# Patient Record
Sex: Male | Born: 1980
Health system: Southern US, Community
[De-identification: ages and names within clinical notes are randomized; demographics above are authoritative.]

## PROBLEM LIST (undated history)

## (undated) DIAGNOSIS — K219 Gastro-esophageal reflux disease without esophagitis: Secondary | ICD-10-CM

## (undated) DIAGNOSIS — F32A Depression, unspecified: Secondary | ICD-10-CM

## (undated) DIAGNOSIS — K449 Diaphragmatic hernia without obstruction or gangrene: Secondary | ICD-10-CM

## (undated) DIAGNOSIS — F329 Major depressive disorder, single episode, unspecified: Secondary | ICD-10-CM

---

## 2004-05-08 ENCOUNTER — Ambulatory Visit: Payer: Self-pay | Admitting: Gastroenterology

## 2004-05-16 ENCOUNTER — Ambulatory Visit (HOSPITAL_COMMUNITY): Admission: RE | Admit: 2004-05-16 | Discharge: 2004-05-16 | Payer: Self-pay | Admitting: Gastroenterology

## 2004-06-03 ENCOUNTER — Ambulatory Visit: Payer: Self-pay | Admitting: Gastroenterology

## 2004-06-03 ENCOUNTER — Encounter: Payer: Self-pay | Admitting: Internal Medicine

## 2004-06-11 ENCOUNTER — Ambulatory Visit (HOSPITAL_COMMUNITY): Admission: RE | Admit: 2004-06-11 | Discharge: 2004-06-11 | Payer: Self-pay | Admitting: Gastroenterology

## 2005-01-13 ENCOUNTER — Emergency Department (HOSPITAL_COMMUNITY): Admission: EM | Admit: 2005-01-13 | Discharge: 2005-01-13 | Payer: Self-pay | Admitting: Emergency Medicine

## 2006-05-05 ENCOUNTER — Emergency Department (HOSPITAL_COMMUNITY): Admission: EM | Admit: 2006-05-05 | Discharge: 2006-05-05 | Payer: Self-pay | Admitting: Emergency Medicine

## 2006-05-29 ENCOUNTER — Emergency Department (HOSPITAL_COMMUNITY): Admission: EM | Admit: 2006-05-29 | Discharge: 2006-05-29 | Payer: Self-pay | Admitting: Emergency Medicine

## 2006-06-12 ENCOUNTER — Ambulatory Visit: Payer: Self-pay | Admitting: Internal Medicine

## 2006-07-03 ENCOUNTER — Emergency Department (HOSPITAL_COMMUNITY): Admission: EM | Admit: 2006-07-03 | Discharge: 2006-07-03 | Payer: Self-pay | Admitting: Emergency Medicine

## 2006-08-04 ENCOUNTER — Emergency Department (HOSPITAL_COMMUNITY): Admission: EM | Admit: 2006-08-04 | Discharge: 2006-08-04 | Payer: Self-pay | Admitting: Family Medicine

## 2006-08-07 ENCOUNTER — Emergency Department (HOSPITAL_COMMUNITY): Admission: EM | Admit: 2006-08-07 | Discharge: 2006-08-07 | Payer: Self-pay | Admitting: Family Medicine

## 2006-12-10 ENCOUNTER — Ambulatory Visit: Payer: Self-pay | Admitting: Internal Medicine

## 2007-05-04 ENCOUNTER — Emergency Department (HOSPITAL_COMMUNITY): Admission: EM | Admit: 2007-05-04 | Discharge: 2007-05-04 | Payer: Self-pay | Admitting: Emergency Medicine

## 2008-08-17 ENCOUNTER — Ambulatory Visit: Payer: Self-pay | Admitting: Gastroenterology

## 2008-08-24 ENCOUNTER — Ambulatory Visit: Payer: Self-pay | Admitting: Gastroenterology

## 2008-08-31 ENCOUNTER — Ambulatory Visit: Payer: Self-pay | Admitting: Internal Medicine

## 2008-09-08 ENCOUNTER — Ambulatory Visit: Payer: Self-pay | Admitting: Gastroenterology

## 2008-09-12 ENCOUNTER — Ambulatory Visit: Payer: Self-pay | Admitting: Internal Medicine

## 2008-09-18 ENCOUNTER — Ambulatory Visit: Payer: Self-pay | Admitting: Gastroenterology

## 2008-09-28 ENCOUNTER — Ambulatory Visit: Payer: Self-pay | Admitting: Gastroenterology

## 2008-10-05 ENCOUNTER — Ambulatory Visit: Payer: Self-pay | Admitting: Gastroenterology

## 2008-10-17 ENCOUNTER — Ambulatory Visit: Payer: Self-pay | Admitting: Gastroenterology

## 2008-10-31 ENCOUNTER — Ambulatory Visit: Payer: Self-pay | Admitting: Gastroenterology

## 2008-11-07 ENCOUNTER — Ambulatory Visit: Payer: Self-pay | Admitting: Gastroenterology

## 2011-08-10 ENCOUNTER — Emergency Department (HOSPITAL_COMMUNITY)
Admission: EM | Admit: 2011-08-10 | Discharge: 2011-08-10 | Disposition: A | Discharge status: Home / Self Care | Payer: Self-pay | Attending: Emergency Medicine | Admitting: Emergency Medicine

## 2011-08-10 ENCOUNTER — Emergency Department (HOSPITAL_COMMUNITY): Payer: Self-pay

## 2011-08-10 ENCOUNTER — Encounter (HOSPITAL_COMMUNITY): Payer: Self-pay

## 2011-08-10 DIAGNOSIS — N453 Epididymo-orchitis: Secondary | ICD-10-CM | POA: Insufficient documentation

## 2011-08-10 DIAGNOSIS — F17200 Nicotine dependence, unspecified, uncomplicated: Secondary | ICD-10-CM | POA: Insufficient documentation

## 2011-08-10 DIAGNOSIS — K219 Gastro-esophageal reflux disease without esophagitis: Secondary | ICD-10-CM | POA: Insufficient documentation

## 2011-08-10 DIAGNOSIS — R1032 Left lower quadrant pain: Secondary | ICD-10-CM | POA: Insufficient documentation

## 2011-08-10 DIAGNOSIS — N451 Epididymitis: Secondary | ICD-10-CM

## 2011-08-10 DIAGNOSIS — N509 Disorder of male genital organs, unspecified: Secondary | ICD-10-CM | POA: Insufficient documentation

## 2011-08-10 HISTORY — DX: Gastro-esophageal reflux disease without esophagitis: K21.9

## 2011-08-10 HISTORY — DX: Diaphragmatic hernia without obstruction or gangrene: K44.9

## 2011-08-10 LAB — URINALYSIS, ROUTINE W REFLEX MICROSCOPIC
Glucose, UA: NEGATIVE mg/dL
Hgb urine dipstick: NEGATIVE
Ketones, ur: NEGATIVE mg/dL
Leukocytes, UA: NEGATIVE
Nitrite: NEGATIVE
Protein, ur: NEGATIVE mg/dL
Specific Gravity, Urine: 1.02 (ref 1.005–1.030)

## 2011-08-10 MED ORDER — DOXYCYCLINE HYCLATE 100 MG PO TABS
100.0000 mg | ORAL_TABLET | Freq: Once | ORAL | Status: AC
  Administered 2011-08-10: 100 mg via ORAL
  Filled 2011-08-10: qty 1

## 2011-08-10 MED ORDER — LIDOCAINE HCL (PF) 1 % IJ SOLN
INTRAMUSCULAR | Status: AC
  Filled 2011-08-10: qty 5

## 2011-08-10 MED ORDER — IBUPROFEN 800 MG PO TABS: 800.0000 mg | ORAL_TABLET | Freq: Once | ORAL | Status: AC

## 2011-08-10 MED ORDER — IBUPROFEN 800 MG PO TABS
800.0000 mg | ORAL_TABLET | Freq: Once | ORAL | Status: AC
  Administered 2011-08-10: 800 mg via ORAL
  Filled 2011-08-10: qty 1

## 2011-08-10 MED ORDER — CEFTRIAXONE SODIUM 250 MG IJ SOLR
250.0000 mg | Freq: Once | INTRAMUSCULAR | Status: AC
  Administered 2011-08-10: 250 mg via INTRAMUSCULAR
  Filled 2011-08-10: qty 250

## 2011-08-10 MED ORDER — DOXYCYCLINE HYCLATE 100 MG PO TABS: 100.0000 mg | ORAL_TABLET | Freq: Two times a day (BID) | ORAL | Status: AC

## 2011-08-10 MED ORDER — LIDOCAINE HCL 2 % IJ SOLN
INTRAMUSCULAR | Status: AC
  Filled 2011-08-10: qty 1

## 2011-08-10 NOTE — Discharge Instructions (Signed)
Epididymitis  Epididymitis is a swelling (inflammation) of the epididymis. The epididymis is a cord-like structure along the back part of the testicle. Epididymitis is usually, but not always, caused by infection. This is usually a sudden problem beginning with chills, fever and pain behind the scrotum and in the testicle. There may be swelling and redness of the testicle.  DIAGNOSIS   Physical examination will reveal a tender, swollen epididymis. Sometimes, cultures are obtained from the urine or from prostate secretions to help find out if there is an infection or if the cause is a different problem. Sometimes, blood work is performed to see if your white blood cell count is elevated and if a germ (bacterial) or viral infection is present. Using this knowledge, an appropriate medicine which kills germs (antibiotic) can be chosen by your caregiver. A viral infection causing epididymitis will most often go away (resolve) without treatment.  HOME CARE INSTRUCTIONS    Hot sitz baths for 20 minutes, 4 times per day, may help relieve pain.   Only take over-the-counter or prescription medicines for pain, discomfort or fever as directed by your caregiver.   Take all medicines, including antibiotics, as directed. Take the antibiotics for the full prescribed length of time even if you are feeling better.   It is very important to keep all follow-up appointments.  SEEK IMMEDIATE MEDICAL CARE IF:    You have a fever.   You have pain not relieved with medicines.   You have any worsening of your problems.   Your pain seems to come and go.   You develop pain, redness, and swelling in the scrotum and surrounding areas.  MAKE SURE YOU:    Understand these instructions.   Will watch your condition.   Will get help right away if you are not doing well or get worse.  Document Released: 05/02/2000 Document Revised: 04/24/2011 Document Reviewed: 03/22/2009  Exit[employer] Patient Information 2012 [employer].

## 2011-08-10 NOTE — ED Notes (Signed)
Pt c/o pain in left lower abdomen since Friday that begin started radiating to the groin yesterday. Pt stated pain was worse this morning.

## 2011-08-10 NOTE — ED Provider Notes (Signed)
History     CSN: [id number]  Arrival date & time 08/10/11  1036   First MD Initiated Contact with Patient 08/10/11 1106      Chief Complaint  Patient presents with  . Groin Pain    (Consider location/radiation/quality/duration/timing/severity/associated sxs/prior treatment) HPI Patient is a 31 year old male who presents today complaining of left testicular pain that began on Friday. Patient noted some pain in his left lower quadrant of his abdomen at that time. Since then the patient has noticed increasing pain. When he is at rest he has no pain at all. However, when the patient palpates the area or standing or moving around he describes 7/10 aching pain. He denies any penile pain or discharge. He has no suspicion that he has sexual transmitted diseases he reports always using protection in the past. He denies any dysuria or hematuria. He has no history of kidney stones. Patient is having no nausea, vomiting, constipation, or diarrhea. There have been no fevers the patient denies any trauma to the area. He's noted no change in the size of his testicles or any other concerning findings. There is nothing else that is affected the patient's pain.There are no other associated or modifying factors.  Past Medical History  Diagnosis Date  . GERD (gastroesophageal reflux disease)   . Hiatal hernia     History reviewed. No pertinent past surgical history.  History reviewed. No pertinent family history.  History  Substance Use Topics  . Smoking status: Current Everyday Smoker  . Smokeless tobacco: Not on file  . Alcohol Use: No      Review of Systems  Constitutional: Negative.   HENT: Negative.   Eyes: Negative.   Respiratory: Negative.   Cardiovascular: Negative.   Gastrointestinal: Positive for abdominal pain.  Genitourinary: Positive for testicular pain.  Musculoskeletal: Negative.   Skin: Negative.   Neurological: Negative.   Hematological: Negative.     Psychiatric/Behavioral: Negative.   All other systems reviewed and are negative.    Allergies  Review of patient's allergies indicates no known allergies.  Home Medications   Current Outpatient Rx  Name Route Sig Dispense Refill  . BUPRENORPHINE HCL-NALOXONE HCL 8-2 MG SL SUBL Sublingual Place 1 tablet under the tongue daily.    Marland Kitchen OMEPRAZOLE 20 MG PO CPDR Oral Take 20 mg by mouth daily.      BP 141/83  Pulse 78  Temp(Src) 98.2 F (36.8 C) (Oral)  Resp 18  SpO2 98%  Physical Exam  Nursing note and vitals reviewed. GEN: Well-developed, well-nourished male in no distress HEENT: Atraumatic, normocephalic. Oropharynx clear without erythema EYES: PERRLA BL, no scleral icterus. NECK: Trachea midline, no meningismus CV: regular rate and rhythm. No murmurs, rubs, or gallops PULM: No respiratory distress.  No crackles, wheezes, or rales. GI: soft, non-tender. No guarding, rebound, or tenderness. + bowel sounds  GU: Circumcised male. No inguinal lymphadenopathy noted. No penile discharge or genital lesions. Left testicle is high riding than the right. No testicular masses are appreciated. Patient does have increased tenderness over the epididymis. Few mesenteric reflex was not demonstrable on either side. Patient reports that his left testicle is not in a particularly different lie than usual. Neuro: cranial nerves 2-12 intact, no abnormalities of strength or sensation, A and O x 3 MSK: Patient moves all 4 extremities symmetrically, no deformity, edema, or injury noted Skin: No rashes petechiae, purpura, or jaundice Psych: no abnormality of mood   ED Course  Procedures (including critical care time)   Labs  Reviewed  URINALYSIS, ROUTINE W REFLEX MICROSCOPIC  URINE CULTURE   US Scrotum  08/10/2011  *RADIOLOGY REPORT*  Clinical Data:  Left scrotal pain and tenderness.  SCROTAL ULTRASOUND DOPPLER ULTRASOUND OF THE TESTICLES  Technique: Complete ultrasound examination of the  testicles, epididymis, and other scrotal structures was performed.  Color and spectral Doppler ultrasound were also utilized to evaluate blood flow to the testicles.  Comparison:  None.  Findings:  Right testis:  Normal in size and echotexture without focal parenchymal abnormality, measuring approximately 4.4 x 2.7 x 3.1 cm.  Normal color Doppler flow.  Left testis:  Normal in size and echotexture without focal parenchymal abnormality, measuring approximately 4.6 x 2.6 x 3.4 cm.  Normal color Doppler flow.  Right epididymis:  Normal in size appearance without focal abnormality.  Left epididymis:  Enlarged and demonstrating hyperemia on color Doppler flow.  Approximate 0.4 cm cyst or spermatocele.  Hydocele:  Small bilateral hydroceles, right greater than left.  Varicocele:  Absent bilaterally.  Pulsed Doppler interrogation of both testes demonstrates normal arterial and venous waveforms from both testes.  IMPRESSION:  1.  Findings consistent with left epididymitis. 2.  Normal-appearing testes bilaterally without evidence of torsion. 3.  Small bilateral hydroceles, right greater than left.  Original Report Authenticated By: Arnell Sieving, M.D.   [place] Art/ven Flow Abd Pelv Doppler  08/10/2011  *RADIOLOGY REPORT*  Clinical Data:  Left scrotal pain and tenderness.  SCROTAL ULTRASOUND DOPPLER ULTRASOUND OF THE TESTICLES  Technique: Complete ultrasound examination of the testicles, epididymis, and other scrotal structures was performed.  Color and spectral Doppler ultrasound were also utilized to evaluate blood flow to the testicles.  Comparison:  None.  Findings:  Right testis:  Normal in size and echotexture without focal parenchymal abnormality, measuring approximately 4.4 x 2.7 x 3.1 cm.  Normal color Doppler flow.  Left testis:  Normal in size and echotexture without focal parenchymal abnormality, measuring approximately 4.6 x 2.6 x 3.4 cm.  Normal color Doppler flow.  Right epididymis:  Normal in size  appearance without focal abnormality.  Left epididymis:  Enlarged and demonstrating hyperemia on color Doppler flow.  Approximate 0.4 cm cyst or spermatocele.  Hydocele:  Small bilateral hydroceles, right greater than left.  Varicocele:  Absent bilaterally.  Pulsed Doppler interrogation of both testes demonstrates normal arterial and venous waveforms from both testes.  IMPRESSION:  1.  Findings consistent with left epididymitis. 2.  Normal-appearing testes bilaterally without evidence of torsion. 3.  Small bilateral hydroceles, right greater than left.  Original Report Authenticated By: Arnell Sieving, M.D.     1. Epididymitis, left       MDM  Patient was evaluated by myself. Patient has had symptoms going on since Friday. It is possible that he could have torsion or epididymitis seems more likely. Urinalysis and ultrasound of the testicle have been ordered. Patient is currently treated with Suboxone and we agreed that the pain medication would be needed this time given that he does not have pain when he is still. Patient was able to tolerate my exam relatively well until that he would be a little tolerate ultrasound exam as well. Urinalysis was unremarkable an ultrasound was consistent with left-sided epididymitis with no evidence of torsion. Patient was treated with Rocephin 250 mg IM and doxycycline 100 mg by mouth. He was advised to use ice, scrotal support, elevation whenever possible, and NSAIDs for his symptoms. Patient was discharged with 10 days of doxycycline twice a day. He was  given the number of [facility] if his symptoms persist after antibiotic treatment. Urine culture was sent as well. Patient was discharged in good condition.        Cyndra Numbers, MD 08/10/11 1246

## 2011-08-10 NOTE — ED Notes (Signed)
Friday having abdominal pain. Saturday started having left testicle pain, today woke with continued pain but crampy diarrhealike feeling in the left abdomen, no swelling or heat noted, no new sexual contacts

## 2011-08-12 LAB — URINE CULTURE
Colony Count: NO GROWTH
Culture  Setup Time: 201303251453

## 2013-07-06 ENCOUNTER — Encounter (HOSPITAL_COMMUNITY): Payer: Self-pay | Admitting: Emergency Medicine

## 2013-07-06 DIAGNOSIS — S99929A Unspecified injury of unspecified foot, initial encounter: Secondary | ICD-10-CM

## 2013-07-06 DIAGNOSIS — S01501A Unspecified open wound of lip, initial encounter: Secondary | ICD-10-CM | POA: Insufficient documentation

## 2013-07-06 DIAGNOSIS — F17200 Nicotine dependence, unspecified, uncomplicated: Secondary | ICD-10-CM | POA: Insufficient documentation

## 2013-07-06 DIAGNOSIS — Y9389 Activity, other specified: Secondary | ICD-10-CM | POA: Insufficient documentation

## 2013-07-06 DIAGNOSIS — Y92410 Unspecified street and highway as the place of occurrence of the external cause: Secondary | ICD-10-CM | POA: Insufficient documentation

## 2013-07-06 DIAGNOSIS — S99919A Unspecified injury of unspecified ankle, initial encounter: Secondary | ICD-10-CM | POA: Insufficient documentation

## 2013-07-06 DIAGNOSIS — S8990XA Unspecified injury of unspecified lower leg, initial encounter: Secondary | ICD-10-CM | POA: Insufficient documentation

## 2013-07-06 DIAGNOSIS — IMO0002 Reserved for concepts with insufficient information to code with codable children: Secondary | ICD-10-CM | POA: Insufficient documentation
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
## 2013-07-07 ENCOUNTER — Emergency Department (HOSPITAL_COMMUNITY)
Admission: EM | Admit: 2013-07-07 | Discharge: 2013-07-07 | Disposition: A | Discharge status: Home / Self Care | Payer: BC Managed Care – PPO | Attending: Emergency Medicine | Admitting: Emergency Medicine

## 2013-07-07 ENCOUNTER — Emergency Department (HOSPITAL_COMMUNITY): Payer: BC Managed Care – PPO

## 2013-07-07 ENCOUNTER — Emergency Department (HOSPITAL_COMMUNITY)
Admission: EM | Admit: 2013-07-07 | Discharge: 2013-07-07 | Discharge status: Against Medical Advice | Payer: BC Managed Care – PPO | Attending: Emergency Medicine | Admitting: Emergency Medicine

## 2013-07-07 ENCOUNTER — Encounter (HOSPITAL_COMMUNITY): Payer: Self-pay | Admitting: Emergency Medicine

## 2013-07-07 DIAGNOSIS — S82899A Other fracture of unspecified lower leg, initial encounter for closed fracture: Secondary | ICD-10-CM | POA: Insufficient documentation

## 2013-07-07 DIAGNOSIS — S01501A Unspecified open wound of lip, initial encounter: Secondary | ICD-10-CM | POA: Insufficient documentation

## 2013-07-07 DIAGNOSIS — Y92410 Unspecified street and highway as the place of occurrence of the external cause: Secondary | ICD-10-CM | POA: Insufficient documentation

## 2013-07-07 DIAGNOSIS — Z79899 Other long term (current) drug therapy: Secondary | ICD-10-CM | POA: Insufficient documentation

## 2013-07-07 DIAGNOSIS — F17200 Nicotine dependence, unspecified, uncomplicated: Secondary | ICD-10-CM | POA: Insufficient documentation

## 2013-07-07 DIAGNOSIS — Z8659 Personal history of other mental and behavioral disorders: Secondary | ICD-10-CM | POA: Insufficient documentation

## 2013-07-07 DIAGNOSIS — Y9389 Activity, other specified: Secondary | ICD-10-CM | POA: Insufficient documentation

## 2013-07-07 DIAGNOSIS — K219 Gastro-esophageal reflux disease without esophagitis: Secondary | ICD-10-CM | POA: Insufficient documentation

## 2013-07-07 HISTORY — DX: Major depressive disorder, single episode, unspecified: F32.9

## 2013-07-07 HISTORY — DX: Depression, unspecified: F32.A

## 2013-07-07 MED ORDER — HYDROCODONE-ACETAMINOPHEN 5-325 MG PO TABS: 2.0000 | ORAL_TABLET | Freq: Four times a day (QID) | ORAL | Status: DC | PRN

## 2013-07-07 MED ORDER — HYDROCODONE-ACETAMINOPHEN 5-325 MG PO TABS
2.0000 | ORAL_TABLET | Freq: Once | ORAL | Status: AC
  Administered 2013-07-07: 2 via ORAL
  Filled 2013-07-07: qty 2

## 2013-07-07 MED ORDER — HYDROMORPHONE HCL PF 1 MG/ML IJ SOLN: 2.0000 mg | Freq: Once | INTRAMUSCULAR | Status: DC
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
## 2013-07-07 NOTE — ED Notes (Signed)
Ortho paged. 

## 2013-07-07 NOTE — Discharge Instructions (Signed)
°  Ankle Fracture °A fracture is a break in the bone. A cast or splint is used to protect and keep your injured bone from moving.  °HOME CARE INSTRUCTIONS  °· Use your crutches as directed. °· To lessen the swelling, keep the injured leg elevated while sitting or lying down. °· Apply ice to the injury for 15-20 minutes, 03-04 times per day while awake for 2 days. Put the ice in a plastic bag and place a thin towel between the bag of ice and your cast. °· If you have a plaster or fiberglass cast: °· Do not try to scratch the skin under the cast using sharp or pointed objects. °· Check the skin around the cast every day. You may put lotion on any red or sore areas. °· Keep your cast dry and clean. °· If you have a plaster splint: °· Wear the splint as directed. °· You may loosen the elastic around the splint if your toes become numb, tingle, or turn cold or blue. °· Do not put pressure on any part of your cast or splint; it may break. Rest your cast only on a pillow the first 24 hours until it is fully hardened. °· Your cast or splint can be protected during bathing with a plastic bag. Do not lower the cast or splint into water. °· Take medications as directed by your caregiver. Only take over-the-counter or prescription medicines for pain, discomfort, or fever as directed by your caregiver. °· Do not drive a vehicle until your caregiver specifically tells you it is safe to do so. °· If your caregiver has given you a follow-up appointment, it is very important to keep that appointment. Not keeping the appointment could result in a chronic or permanent injury, pain, and disability. If there is any problem keeping the appointment, you must call back to this facility for assistance. °SEEK IMMEDIATE MEDICAL CARE IF:  °· Your cast gets damaged or breaks. °· You have continued severe pain or more swelling than you did before the cast was put on. °· Your skin or toenails below the injury turn blue or gray, or feel cold or  numb. °· There is a bad smell or new stains and/or purulent (pus like) drainage coming from under the cast. °If you do not have a window in your cast for observing the wound, a discharge or minor bleeding may show up as a stain on the outside of your cast. Report these findings to your caregiver. °MAKE SURE YOU:  °· Understand these instructions. °· Will watch your condition. °· Will get help right away if you are not doing well or get worse. °Document Released: 05/02/2000 Document Revised: 07/28/2011 Document Reviewed: 12/02/2012 °[employer]® Patient Information ©2014 [employer]. ° ° °
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
## 2013-07-07 NOTE — ED Provider Notes (Signed)
Patient left before provider could evaluate.  Arthor CaptainAbigail Loana Salvaggio, PA-C 07/07/13 0124

## 2013-07-07 NOTE — Progress Notes (Signed)
Orthopedic Tech Progress Note Patient Details:  Chriss CzarDaniel L Hallberg 10/14/1980 [id number]  Ortho Devices Type of Ortho Device: CAM walker;Crutches Ortho Device/Splint Location: LLE Ortho Device/Splint Interventions: Casandra DoffingOrdered   Robbin Escher Craig 07/07/2013, 5:04 PM

## 2013-07-07 NOTE — ED Provider Notes (Signed)
Medical screening examination/treatment/procedure(s) were performed by non-physician practitioner and as supervising physician I was immediately available for consultation/collaboration.  EKG Interpretation   None        Maeven Mcdougall K Jurni Cesaro-Rasch, MD 07/07/13 [phone]
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
## 2013-07-08 ENCOUNTER — Emergency Department (HOSPITAL_COMMUNITY)
Admission: EM | Admit: 2013-07-08 | Discharge: 2013-07-08 | Disposition: A | Discharge status: Home / Self Care | Payer: BC Managed Care – PPO | Attending: Emergency Medicine | Admitting: Emergency Medicine

## 2013-07-08 ENCOUNTER — Encounter (HOSPITAL_COMMUNITY): Payer: Self-pay | Admitting: Emergency Medicine

## 2013-07-08 DIAGNOSIS — K219 Gastro-esophageal reflux disease without esophagitis: Secondary | ICD-10-CM | POA: Insufficient documentation

## 2013-07-08 DIAGNOSIS — M25579 Pain in unspecified ankle and joints of unspecified foot: Secondary | ICD-10-CM | POA: Insufficient documentation

## 2013-07-08 DIAGNOSIS — S00511A Abrasion of lip, initial encounter: Secondary | ICD-10-CM

## 2013-07-08 DIAGNOSIS — F329 Major depressive disorder, single episode, unspecified: Secondary | ICD-10-CM | POA: Insufficient documentation

## 2013-07-08 DIAGNOSIS — Y9389 Activity, other specified: Secondary | ICD-10-CM | POA: Insufficient documentation

## 2013-07-08 DIAGNOSIS — F3289 Other specified depressive episodes: Secondary | ICD-10-CM | POA: Insufficient documentation

## 2013-07-08 DIAGNOSIS — F17200 Nicotine dependence, unspecified, uncomplicated: Secondary | ICD-10-CM | POA: Insufficient documentation

## 2013-07-08 DIAGNOSIS — M25572 Pain in left ankle and joints of left foot: Secondary | ICD-10-CM

## 2013-07-08 DIAGNOSIS — G8911 Acute pain due to trauma: Secondary | ICD-10-CM | POA: Insufficient documentation

## 2013-07-08 DIAGNOSIS — IMO0002 Reserved for concepts with insufficient information to code with codable children: Secondary | ICD-10-CM | POA: Insufficient documentation

## 2013-07-08 DIAGNOSIS — Y92410 Unspecified street and highway as the place of occurrence of the external cause: Secondary | ICD-10-CM | POA: Insufficient documentation

## 2013-07-08 MED ORDER — HYDROCODONE-ACETAMINOPHEN 5-325 MG PO TABS
2.0000 | ORAL_TABLET | Freq: Once | ORAL | Status: AC
  Administered 2013-07-08: 2 via ORAL
  Filled 2013-07-08: qty 2

## 2013-07-08 MED ORDER — HYDROCODONE-ACETAMINOPHEN 5-325 MG PO TABS: ORAL_TABLET | ORAL | Status: DC

## 2013-07-08 NOTE — ED Notes (Signed)
PT ambulated with baseline gait; VSS; A&Ox3; no signs of distress; respirations even and unlabored; skin warm and dry; no questions upon discharge.  
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
## 2013-07-09 NOTE — ED Provider Notes (Signed)
Medical screening examination/treatment/procedure(s) were performed by non-physician practitioner and as supervising physician I was immediately available for consultation/collaboration.  EKG Interpretation   None         Dagmar HaitWilliam Sharri Loya, MD 07/09/13 [phone]

## 2013-07-15 ENCOUNTER — Ambulatory Visit: Payer: Self-pay | Admitting: Family Medicine

## 2013-07-15 NOTE — ED Provider Notes (Signed)
Medical screening examination/treatment/procedure(s) were performed by non-physician practitioner and as supervising physician I was immediately available for consultation/collaboration.  EKG Interpretation  None    Izek Corvino Y. Brittnee Gaetano, MD 07/15/13 1033 

## 2013-10-28 ENCOUNTER — Encounter (HOSPITAL_COMMUNITY): Payer: Self-pay | Admitting: Emergency Medicine

## 2013-10-28 ENCOUNTER — Emergency Department (HOSPITAL_COMMUNITY)
Admission: EM | Admit: 2013-10-28 | Discharge: 2013-10-28 | Disposition: A | Discharge status: Home / Self Care | Payer: BC Managed Care – PPO | Attending: Emergency Medicine | Admitting: Emergency Medicine

## 2013-10-28 ENCOUNTER — Emergency Department (INDEPENDENT_AMBULATORY_CARE_PROVIDER_SITE_OTHER)
Admission: EM | Admit: 2013-10-28 | Discharge: 2013-10-28 | Disposition: A | Discharge status: Hospice | Payer: BC Managed Care – PPO | Source: Home / Self Care | Attending: Emergency Medicine | Admitting: Emergency Medicine

## 2013-10-28 ENCOUNTER — Emergency Department (HOSPITAL_COMMUNITY): Payer: BC Managed Care – PPO

## 2013-10-28 DIAGNOSIS — L03119 Cellulitis of unspecified part of limb: Secondary | ICD-10-CM

## 2013-10-28 DIAGNOSIS — IMO0002 Reserved for concepts with insufficient information to code with codable children: Secondary | ICD-10-CM | POA: Insufficient documentation

## 2013-10-28 DIAGNOSIS — F17200 Nicotine dependence, unspecified, uncomplicated: Secondary | ICD-10-CM | POA: Insufficient documentation

## 2013-10-28 DIAGNOSIS — L0291 Cutaneous abscess, unspecified: Secondary | ICD-10-CM

## 2013-10-28 DIAGNOSIS — K219 Gastro-esophageal reflux disease without esophagitis: Secondary | ICD-10-CM | POA: Insufficient documentation

## 2013-10-28 DIAGNOSIS — F3289 Other specified depressive episodes: Secondary | ICD-10-CM | POA: Insufficient documentation

## 2013-10-28 DIAGNOSIS — Z23 Encounter for immunization: Secondary | ICD-10-CM | POA: Insufficient documentation

## 2013-10-28 DIAGNOSIS — F329 Major depressive disorder, single episode, unspecified: Secondary | ICD-10-CM | POA: Insufficient documentation

## 2013-10-28 MED ORDER — TETANUS-DIPHTH-ACELL PERTUSSIS 5-2.5-18.5 LF-MCG/0.5 IM SUSP
INTRAMUSCULAR | Status: AC
  Filled 2013-10-28: qty 0.5

## 2013-10-28 MED ORDER — TETANUS-DIPHTH-ACELL PERTUSSIS 5-2.5-18.5 LF-MCG/0.5 IM SUSP
0.5000 mL | Freq: Once | INTRAMUSCULAR | Status: AC
  Administered 2013-10-28: 0.5 mL via INTRAMUSCULAR

## 2013-10-28 MED ORDER — SULFAMETHOXAZOLE-TRIMETHOPRIM 800-160 MG PO TABS: 1.0000 | ORAL_TABLET | Freq: Two times a day (BID) | ORAL | Status: DC

## 2013-10-28 MED ORDER — CEPHALEXIN 500 MG PO CAPS: 500.0000 mg | ORAL_CAPSULE | Freq: Four times a day (QID) | ORAL | Status: DC
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
## 2013-10-28 NOTE — Discharge Instructions (Signed)
Abscess An abscess is an infected area that contains a collection of pus and debris.It can occur in almost any part of the body. An abscess is also known as a furuncle or boil. CAUSES  An abscess occurs when tissue gets infected. This can occur from blockage of oil or sweat glands, infection of hair follicles, or a minor injury to the skin. As the body tries to fight the infection, pus collects in the area and creates pressure under the skin. This pressure causes pain. People with weakened immune systems have difficulty fighting infections and get certain abscesses more often.  SYMPTOMS Usually an abscess develops on the skin and becomes a painful mass that is red, warm, and tender. If the abscess forms under the skin, you may feel a moveable soft area under the skin. Some abscesses break open (rupture) on their own, but most will continue to get worse without care. The infection can spread deeper into the body and eventually into the bloodstream, causing you to feel ill.  DIAGNOSIS  Your caregiver will take your medical history and perform a physical exam. A sample of fluid may also be taken from the abscess to determine what is causing your infection. TREATMENT  Your caregiver may prescribe antibiotic medicines to fight the infection. However, taking antibiotics alone usually does not cure an abscess. Your caregiver may need to make a small cut (incision) in the abscess to drain the pus. In some cases, gauze is packed into the abscess to reduce pain and to continue draining the area. HOME CARE INSTRUCTIONS   Only take over-the-counter or prescription medicines for pain, discomfort, or fever as directed by your caregiver.  If you were prescribed antibiotics, take them as directed. Finish them even if you start to feel better.  If gauze is used, follow your caregiver's directions for changing the gauze.  To avoid spreading the infection:  Keep your draining abscess covered with a  bandage.  Wash your hands well.  Do not share personal care items, towels, or whirlpools with others.  Avoid skin contact with others.  Keep your skin and clothes clean around the abscess.  Keep all follow-up appointments as directed by your caregiver. SEEK MEDICAL CARE IF:   You have increased pain, swelling, redness, fluid drainage, or bleeding.  You have muscle aches, chills, or a general ill feeling.  You have a fever. MAKE SURE YOU:   Understand these instructions.  Will watch your condition.  Will get help right away if you are not doing well or get worse. Document Released: 02/12/2005 Document Revised: 11/04/2011 Document Reviewed: 07/18/2011 [employer] Patient Information 2014 [employer].  

## 2013-10-28 NOTE — ED Notes (Signed)
The patient is a heroin user, he injected on Monday and missed the vein. His left arm is swollen, red and hot to the touch.  [facility].  The patient rates his pain 7/10.

## 2013-10-28 NOTE — ED Provider Notes (Signed)
CSN: [id number]     Arrival date & time 10/28/13  1310 History   First MD Initiated Contact with Patient 10/28/13 1320     Chief Complaint  Patient presents with  . Cellulitis    The patient is a heroin user, he injected on Monday and missed the vein. His left arm is swollen, red and hot to the touch.  [facility] for [facility] treatment.     (Consider location/radiation/quality/duration/timing/severity/associated sxs/prior Treatment) HPI Comments: Patient presents to the emergency department with chief complaint of cellulitis. He states that he was using heroin on Monday, and missed his pain. He complains of left arm swelling, and pain. He was seen in urgent care, and was told to come to the emergency department for further evaluation and treatment. He rates his pain as a 7/10. He is able to flex and extend his elbow. He denies fevers, chills, nausea, or vomiting. He is tried using warm compresses with no relief. He states that his injection needle was clean and new.  The history is provided by the patient. No language interpreter was used.    Past Medical History  Diagnosis Date  . GERD (gastroesophageal reflux disease)   . Hiatal hernia   . Depression    History reviewed. No pertinent past surgical history. History reviewed. No pertinent family history. History  Substance Use Topics  . Smoking status: Current Every Day Smoker  . Smokeless tobacco: Not on file  . Alcohol Use: No    Review of Systems  Constitutional: Negative for fever and chills.  Respiratory: Negative for shortness of breath.   Cardiovascular: Negative for chest pain.  Gastrointestinal: Negative for nausea, vomiting, diarrhea and constipation.  Genitourinary: Negative for dysuria.  Skin: Positive for rash and wound.      Allergies  Review of patient's allergies indicates no known allergies.  Home Medications   Prior to Admission medications   Medication Sig Start Date  End Date Taking? Authorizing Provider  ranitidine (ZANTAC) 150 MG tablet Take 150 mg by mouth daily.   Yes Historical Provider, MD  Vortioxetine HBr (BRINTELLIX) 10 MG TABS Take 10 mg by mouth daily.   Yes Historical Provider, MD   BP 159/112  Pulse 72  Temp(Src) 98.2 F (36.8 C) (Oral)  Resp 18  SpO2 100% Physical Exam  Nursing note and vitals reviewed. Constitutional: He is oriented to person, place, and time. He appears well-developed and well-nourished.  HENT:  Head: Normocephalic and atraumatic.  Eyes: Conjunctivae and EOM are normal. Pupils are equal, round, and reactive to light. Right eye exhibits no discharge. Left eye exhibits no discharge. No scleral icterus.  Neck: Normal range of motion. Neck supple. No JVD present.  Cardiovascular: Normal rate, regular rhythm and normal heart sounds.  Exam reveals no gallop and no friction rub.   No murmur heard. Pulmonary/Chest: Effort normal and breath sounds normal. No respiratory distress. He has no wheezes. He has no rales. He exhibits no tenderness.  Abdominal: Soft. He exhibits no distension and no mass. There is no tenderness. There is no rebound and no guarding.  Musculoskeletal: Normal range of motion. He exhibits no edema and no tenderness.  Left elbow flexion and extension 5/5, though somewhat painful with full flexion  Neurological: He is alert and oriented to person, place, and time.  Skin: Skin is warm and dry.  3 x 3 cm area of erythema and induration on the left antecubital region, no obvious streaking or  extension  Psychiatric: He has a normal mood and affect. His behavior is normal. Judgment and thought content normal.    ED Course  Procedures (including critical care time) Labs Review Labs Reviewed - No data to display  Imaging Review Dg Elbow 2 Views Left  10/28/2013   CLINICAL DATA:  By history, user of intravenous recreational drugs who injected himself 4 days ago and missed the antecubital vein, now presents  with edema and erythema at the injection site at the left elbow.  EXAM: LEFT ELBOW - 2 VIEW  COMPARISON:  None.  FINDINGS: Soft tissue swelling. No evidence of acute fracture or dislocation. Well-preserved joint spaces. No intrinsic osseous abnormalities. No posterior fat pad to confirm joint effusion or hemarthrosis.  IMPRESSION: No osseous abnormality.   Electronically Signed   By: Hulan Saashomas  Lawrence M.D.   On: 10/28/2013 14:25     EKG Interpretation None     INCISION AND DRAINAGE Performed by: Roxy HorsemanBROWNING, Koleton Duchemin Consent: Verbal consent obtained. Risks and benefits: risks, benefits and alternatives were discussed Type: abscess  Body area: left elbow  Anesthesia: local infiltration  Incision was made with a scalpel.  Local anesthetic: lidocaine 2% without epinephrine  Anesthetic total: 3 ml  Complexity: complex Blunt dissection to break up loculations  Drainage: purulent  Drainage amount: copious  Packing material: none  Patient tolerance: Patient tolerated the procedure well with no immediate complications.    MDM   Final diagnoses:  Abscess    IV drug abuser with new area of cellulitis on the left antecubital region. Will check plain films, and reassess. Tetanus is up-to-date.  Patient seen by and discussed with Dr. Anitra LauthPlunkett.  No evidence of septic joint.  Abscess drained successfully. Will discharge home on Keflex and Bactrim. Patient is stable and ready for discharge.   Roxy Horsemanobert Lashonda Sonneborn, PA-C 10/28/13 1520

## 2013-10-28 NOTE — ED Provider Notes (Signed)
Medical screening examination/treatment/procedure(s) were performed by non-physician practitioner and as supervising physician I was immediately available for consultation/collaboration.  Leslee Homeavid Brownie Nehme, M.D.  Reuben Likesavid C Jezebel Pollet, MD 10/28/13 [phone]

## 2013-10-28 NOTE — ED Provider Notes (Signed)
Medical screening examination/treatment/procedure(s) were conducted as a shared visit with non-physician practitioner(s) and myself.  I personally evaluated the patient during the encounter.   EKG Interpretation None      Pt with IV drug use and abscess with cellulitis.  No signs of septic joint.  Gwyneth SproutWhitney Zeeshan Korte, MD 10/28/13 1525

## 2013-10-28 NOTE — ED Provider Notes (Signed)
CSN: [id number]     Arrival date & time 10/28/13  1047 History   First MD Initiated Contact with Patient 10/28/13 1148     Chief Complaint  Patient presents with  . Recurrent Skin Infections   (Consider location/radiation/quality/duration/timing/severity/associated sxs/prior Treatment) HPI Comments: Patient recently discontinued Suboxone therapy for management of former heroin addiction. During withdrawal patient reports himself to have been "in too much pain and miserable" and purchased and used IV heroin on 10/25/2013. Reports over th past 24-36 hours he has developed redness, swelling tenderness at injection site (Left anti cubital fossa) with painful ROM of left elbow. No reported fever/chills. Reports that he is unable to fully extend his left arm at elbow without pain.   The history is provided by the patient.    Past Medical History  Diagnosis Date  . GERD (gastroesophageal reflux disease)   . Hiatal hernia   . Depression    History reviewed. No pertinent past surgical history. History reviewed. No pertinent family history. History  Substance Use Topics  . Smoking status: Current Every Day Smoker  . Smokeless tobacco: Not on file  . Alcohol Use: No    Review of Systems  All other systems reviewed and are negative.   Allergies  Review of patient's allergies indicates no known allergies.  Home Medications   Prior to Admission medications   Medication Sig Start Date End Date Taking? Authorizing Provider  ranitidine (ZANTAC) 150 MG tablet Take 150 mg by mouth daily.    Historical Provider, MD  Vortioxetine HBr (BRINTELLIX) 10 MG TABS Take 10 mg by mouth daily.    Historical Provider, MD   BP 154/95  Pulse 78  Temp(Src) 98.3 F (36.8 C) (Oral)  Resp 14  SpO2 98% Physical Exam  Nursing note and vitals reviewed. Constitutional: He is oriented to person, place, and time. He appears well-developed and well-nourished. No distress.  HENT:  Head: Normocephalic and  atraumatic.  Eyes: Conjunctivae are normal. No scleral icterus.  Cardiovascular: Normal rate, regular rhythm and normal heart sounds.   No murmur heard. Pulmonary/Chest: Effort normal and breath sounds normal.  Musculoskeletal:       Left elbow: He exhibits decreased range of motion and swelling. Tenderness found.  Limited extension at left elbow. Radial pulse and sensory exam of left forearm and hand normal/intact  Neurological: He is alert and oriented to person, place, and time.  Skin: Skin is warm and dry.  +circumfrential indurated cellulitis at left elbow with firm nodule at left AC fossa without central palpable fluctuance. Erythema extends to mid humerus.   Psychiatric: He has a normal mood and affect. His behavior is normal.    ED Course  Procedures (including critical care time) Labs Review Labs Reviewed - No data to display  Imaging Review No results found.   MDM   1. Cellulitis of elbow   33 y/o male with recent IVDA with cellulitis of left elbow and early discomfort and decreased ROM of left elbow. [facility].    Jess BartersJennifer Lee [place], [place] 10/28/13 [phone]

## 2014-12-06 ENCOUNTER — Emergency Department (HOSPITAL_COMMUNITY)
Admission: EM | Admit: 2014-12-06 | Discharge: 2014-12-06 | Disposition: A | Discharge status: Home / Self Care | Payer: BLUE CROSS/BLUE SHIELD | Source: Home / Self Care | Attending: Family Medicine | Admitting: Family Medicine

## 2014-12-06 ENCOUNTER — Encounter (HOSPITAL_COMMUNITY): Payer: Self-pay | Admitting: Emergency Medicine

## 2014-12-06 DIAGNOSIS — J029 Acute pharyngitis, unspecified: Secondary | ICD-10-CM | POA: Diagnosis not present

## 2014-12-06 LAB — POCT RAPID STREP A: STREPTOCOCCUS, GROUP A SCREEN (DIRECT): NEGATIVE

## 2014-12-06 MED ORDER — AMOXICILLIN 500 MG PO CAPS: 500.0000 mg | ORAL_CAPSULE | Freq: Two times a day (BID) | ORAL | Status: DC

## 2014-12-06 NOTE — ED Provider Notes (Signed)
CSN: [id number]     Arrival date & time 12/06/14  1418 History   First MD Initiated Contact with Patient 12/06/14 1532     Chief Complaint  Patient presents with  . Sore Throat   (Consider location/radiation/quality/duration/timing/severity/associated sxs/prior Treatment) HPI Sore throat and swollen neck glands. Started last night. Constant. Getting worse. No runny nose or cough, or sinus pressure. Endorses subjective fevers. Deneis nausea vomiting shortness of breath palpitations abdominal pain dysuria frequency back pain neck stiffness.   Past Medical History  Diagnosis Date  . GERD (gastroesophageal reflux disease)   . Hiatal hernia   . Depression    History reviewed. No pertinent past surgical history. No family history on file. History  Substance Use Topics  . Smoking status: Current Every Day Smoker  . Smokeless tobacco: Not on file  . Alcohol Use: No    Review of Systems Per HPI with all other pertinent systems negative.   Allergies  Review of patient's allergies indicates no known allergies.  Home Medications   Prior to Admission medications   Medication Sig Start Date End Date Taking? Authorizing Provider  buprenorphine-naloxone (SUBOXONE) 2-0.5 MG SUBL SL tablet Place 1 tablet under the tongue daily.   Yes Historical Provider, MD  omeprazole (PRILOSEC) 20 MG capsule Take 20 mg by mouth daily.   Yes Historical Provider, MD  amoxicillin (AMOXIL) 500 MG capsule Take 1 capsule (500 mg total) by mouth 2 (two) times daily. 12/06/14   Ozella Rocksavid J Alauna Hayden, MD   BP 135/93 mmHg  Pulse 82  Temp(Src) 99.2 F (37.3 C) (Oral)  Resp 16  SpO2 99% Physical Exam Physical Exam  Constitutional: oriented to person, place, and time. appears well-developed and well-nourished. No distress.  HENT:  Left anterior cervical lymphadenopathy, pharyngeal injection, Head: Normocephalic and atraumatic.  Eyes: EOMI. PERRL.  Neck: Normal range of motion.  Cardiovascular: RRR, no m/r/g, 2+  distal pulses,  Pulmonary/Chest: Effort normal and breath sounds normal. No respiratory distress.  Abdominal: Soft. Bowel sounds are normal. NonTTP, no distension.  Musculoskeletal: Normal range of motion. Non ttp, no effusion.  Neurological: alert and oriented to person, place, and time.  Skin: Skin is warm. No rash noted. non diaphoretic.  Psychiatric: normal mood and affect. behavior is normal. Judgment and thought content normal.   ED Course  Procedures (including critical care time) Labs Review Labs Reviewed - No data to display  Imaging Review No results found.   MDM   1. Pharyngitis    Suspect possible strep pharyngitis versus reactive lymphadenitis. Unlikely to be due to submandibular gland inflammation and infection though cannot entirely exclude this. Patient to start amoxicillin, continue ibuprofen, start eating kidney that will increase oral secretions. Culture sent.    Ozella Rocksavid J Sanae Willetts, MD 12/06/14 [phone]

## 2014-12-06 NOTE — Discharge Instructions (Signed)
You likely have developed strep throat Please start the antibitics and continue the ibuprofen Please go to the emergency room if you get worse

## 2014-12-06 NOTE — ED Notes (Signed)
Pt c/o ST onset yest eve associated w/odynophagia Also reports fevers and chills and pain increases w/activity Alert, no signs of acute distress.

## 2014-12-08 LAB — CULTURE, GROUP A STREP: Strep A Culture: NEGATIVE

## 2014-12-08 NOTE — ED Notes (Signed)
Final report of strep test negative 

## 2015-07-09 ENCOUNTER — Encounter: Payer: Self-pay | Admitting: Internal Medicine

## 2016-01-17 DIAGNOSIS — J40 Bronchitis, not specified as acute or chronic: Secondary | ICD-10-CM | POA: Diagnosis not present

## 2016-01-17 DIAGNOSIS — F3130 Bipolar disorder, current episode depressed, mild or moderate severity, unspecified: Secondary | ICD-10-CM | POA: Diagnosis not present

## 2016-01-17 DIAGNOSIS — G629 Polyneuropathy, unspecified: Secondary | ICD-10-CM | POA: Diagnosis not present

## 2016-01-28 DIAGNOSIS — J0100 Acute maxillary sinusitis, unspecified: Secondary | ICD-10-CM | POA: Diagnosis not present

## 2016-01-28 DIAGNOSIS — Z6828 Body mass index (BMI) 28.0-28.9, adult: Secondary | ICD-10-CM | POA: Diagnosis not present

## 2016-01-28 DIAGNOSIS — R05 Cough: Secondary | ICD-10-CM | POA: Diagnosis not present

## 2016-01-28 DIAGNOSIS — R030 Elevated blood-pressure reading, without diagnosis of hypertension: Secondary | ICD-10-CM | POA: Diagnosis not present

## 2016-04-18 DIAGNOSIS — F419 Anxiety disorder, unspecified: Secondary | ICD-10-CM | POA: Diagnosis not present

## 2016-04-25 DIAGNOSIS — F329 Major depressive disorder, single episode, unspecified: Secondary | ICD-10-CM | POA: Diagnosis not present

## 2016-04-25 DIAGNOSIS — F419 Anxiety disorder, unspecified: Secondary | ICD-10-CM | POA: Diagnosis not present

## 2016-05-02 DIAGNOSIS — F419 Anxiety disorder, unspecified: Secondary | ICD-10-CM | POA: Diagnosis not present

## 2016-05-02 DIAGNOSIS — F329 Major depressive disorder, single episode, unspecified: Secondary | ICD-10-CM | POA: Diagnosis not present

## 2016-05-09 DIAGNOSIS — F419 Anxiety disorder, unspecified: Secondary | ICD-10-CM | POA: Diagnosis not present

## 2016-05-09 DIAGNOSIS — F329 Major depressive disorder, single episode, unspecified: Secondary | ICD-10-CM | POA: Diagnosis not present

## 2016-05-30 DIAGNOSIS — F419 Anxiety disorder, unspecified: Secondary | ICD-10-CM | POA: Diagnosis not present

## 2016-08-01 DIAGNOSIS — Z0001 Encounter for general adult medical examination with abnormal findings: Secondary | ICD-10-CM | POA: Diagnosis not present

## 2016-08-01 DIAGNOSIS — F419 Anxiety disorder, unspecified: Secondary | ICD-10-CM | POA: Diagnosis not present

## 2016-08-01 DIAGNOSIS — F319 Bipolar disorder, unspecified: Secondary | ICD-10-CM | POA: Diagnosis not present

## 2016-08-01 DIAGNOSIS — R7989 Other specified abnormal findings of blood chemistry: Secondary | ICD-10-CM | POA: Diagnosis not present

## 2016-08-01 DIAGNOSIS — Z833 Family history of diabetes mellitus: Secondary | ICD-10-CM | POA: Diagnosis not present

## 2016-12-05 DIAGNOSIS — F319 Bipolar disorder, unspecified: Secondary | ICD-10-CM | POA: Diagnosis not present

## 2016-12-05 DIAGNOSIS — K219 Gastro-esophageal reflux disease without esophagitis: Secondary | ICD-10-CM | POA: Diagnosis not present

## 2017-03-23 ENCOUNTER — Encounter: Payer: Self-pay | Admitting: Family Medicine

## 2017-03-23 ENCOUNTER — Ambulatory Visit (INDEPENDENT_AMBULATORY_CARE_PROVIDER_SITE_OTHER): Payer: BLUE CROSS/BLUE SHIELD | Admitting: Family Medicine

## 2017-03-23 VITALS — BP 120/86 | Temp 98.4°F | Ht 66.0 in | Wt 177.0 lb

## 2017-03-23 DIAGNOSIS — N138 Other obstructive and reflux uropathy: Secondary | ICD-10-CM

## 2017-03-23 DIAGNOSIS — G4700 Insomnia, unspecified: Secondary | ICD-10-CM | POA: Diagnosis not present

## 2017-03-23 DIAGNOSIS — K219 Gastro-esophageal reflux disease without esophagitis: Secondary | ICD-10-CM

## 2017-03-23 DIAGNOSIS — F321 Major depressive disorder, single episode, moderate: Secondary | ICD-10-CM

## 2017-03-23 DIAGNOSIS — G5793 Unspecified mononeuropathy of bilateral lower limbs: Secondary | ICD-10-CM | POA: Diagnosis not present

## 2017-03-23 DIAGNOSIS — N401 Enlarged prostate with lower urinary tract symptoms: Secondary | ICD-10-CM | POA: Diagnosis not present

## 2017-03-23 MED ORDER — ZOLPIDEM TARTRATE 10 MG PO TABS: 10.0000 mg | ORAL_TABLET | Freq: Every evening | ORAL | 2 refills | Status: DC | PRN

## 2017-03-23 MED ORDER — OMEPRAZOLE 20 MG PO CPDR: 20.0000 mg | DELAYED_RELEASE_CAPSULE | Freq: Every day | ORAL | 5 refills | Status: DC

## 2017-03-23 MED ORDER — BUPROPION HCL ER (XL) 300 MG PO TB24: 300.0000 mg | ORAL_TABLET | Freq: Every day | ORAL | 5 refills | Status: DC

## 2017-03-23 NOTE — Patient Instructions (Signed)
WE NOW OFFER   [facility] FAST TRACK!!!  SAME DAY Appointments for ACUTE CARE  Such as: Sprains, Injuries, cuts, abrasions, rashes, muscle pain, joint pain, back pain Colds, flu, sore throats, headache, allergies, cough, fever  Ear pain, sinus and eye infections Abdominal pain, nausea, vomiting, diarrhea, upset stomach Animal/insect bites  3 Easy Ways to Schedule: Walk-In Scheduling Call in scheduling Mychart Sign-up: [email or web address]         

## 2017-03-23 NOTE — Progress Notes (Signed)
   Subjective:    Patient ID: Ryan Olsen, male    DOB: 10/19/1980, 36 y.o.   MRN: [id number]  HPI 36 yr old male to establish with us. He is originally from [place] but he lived in [place] several years to be with his girlfriend. After they broke up he moved back here 2 months ago. He asks for some refills. He had been taking Wellbutrin for depression and this has worked well for him. However he has a long hx of insomnia as well. He has tried Seroquel, Trazadone, and Temazepam in the past with poor results. Lately he has tried some of his mother's Ambien and this has helped him sleep. This works best by taking 1/2 a pill at bedtime and then taking the other 1/2 during the night when he wakes up. He asks for his own supply. He has taken Gabapentin for what sounds like a neuropathy. He has a patch of skin on the anterior left thigh that is numb and it tingles at times. He also mentions having trouble passing urine. His stream is slow and interrupted, and he dribbles at the end. He gets up 5-6 times a night to urinate. There is no discomfort. He notes his father and GF both have had prostate cancer. Also he has frequent GERD andhas used Omeprazole successfully. He asks for a rx for this.   Review of Systems  Constitutional: Negative.   Respiratory: Negative.   Cardiovascular: Negative.   Gastrointestinal: Negative.   Genitourinary: Positive for difficulty urinating and frequency. Negative for dysuria, hematuria and urgency.  Neurological: Negative.   Psychiatric/Behavioral: Positive for dysphoric mood and sleep disturbance. Negative for agitation, behavioral problems, confusion, decreased concentration, hallucinations, self-injury and suicidal ideas. The patient is not nervous/anxious.        Objective:   Physical Exam  Constitutional: He is oriented to person, place, and time. He appears well-developed and well-nourished.  Cardiovascular: Normal rate, regular rhythm, normal heart sounds and  intact distal pulses.  Pulmonary/Chest: Effort normal and breath sounds normal. No respiratory distress. He has no wheezes. He has no rales.  Neurological: He is alert and oriented to person, place, and time.  Psychiatric: He has a normal mood and affect. His behavior is normal. Judgment and thought content normal.          Assessment & Plan:  Introductory visit for this patient with depression, insomnia, neuropathy, and urinary issues that are likely due to BPH. We will refill Wellbutrin XL 300 mg to take daily. He will try Zolpidem 10 mg qhs for sleep. We refilled Gabapentin 600 mg qhs for the neuropathy. He will return soon for a complete exam with fasting labs, to include a DRE and a PSA, given his family history. Wrote for Omeprazole 20 mg daily for GERD. Gershon CraneStephen Loranzo Desha, MD

## 2017-03-24 ENCOUNTER — Encounter: Payer: Self-pay | Admitting: Family Medicine

## 2017-03-25 ENCOUNTER — Ambulatory Visit: Payer: BLUE CROSS/BLUE SHIELD | Admitting: Family Medicine

## 2017-03-26 ENCOUNTER — Telehealth: Payer: Self-pay | Admitting: Family Medicine

## 2017-03-26 NOTE — Telephone Encounter (Signed)
° ° ° °  Pt cal lto say the below is not really working for him. He said it will put him to sleep but not keep him sleep. He said he wakes up after about 3 hours. Would like a call back   [phone]

## 2017-03-26 NOTE — Telephone Encounter (Signed)
Tell him to stop the Ambien. Try Remeron 15 mg every night at bedtime. He needs to take this regularly and it may take a week or two to see the benefits. Call in #30 with 2 rf

## 2017-03-26 NOTE — Telephone Encounter (Signed)
Try taking 2 at a time for a few nights

## 2017-03-26 NOTE — Telephone Encounter (Signed)
I spoke with pt and he says that the very next morning after taking medication he feels sick on stomach for a least 2 hours or so, please advise?

## 2017-03-27 MED ORDER — MIRTAZAPINE 15 MG PO TABS: 15.0000 mg | ORAL_TABLET | Freq: Every day | ORAL | 2 refills | Status: DC

## 2017-03-27 NOTE — Telephone Encounter (Signed)
Pt advised and voiced understanding. Called pt's pharmacy and left a VM to put Ambien on hold for now while pt try's new medication Remeron.

## 2017-03-27 NOTE — Telephone Encounter (Signed)
Called pt and left a VM to call back. Ambien taken off pt's med. list and Rx called in for  Remeron 15 MG.

## 2017-04-14 ENCOUNTER — Telehealth: Payer: Self-pay | Admitting: Family Medicine

## 2017-04-14 NOTE — Telephone Encounter (Signed)
Sent to PCP ?

## 2017-04-14 NOTE — Telephone Encounter (Signed)
Copied from CRM [id number]. Topic: Quick Communication - Rx Refill/Question >> Apr 14, 2017  2:58 PM Leafy Roobinson, Norma J wrote: Has the patient contacted their pharmacy? no  (Agent: If no, request that the patient contact the pharmacy for the refill.)   Preferred Pharmacy (with phone number or street name): [facility] [phone]  Agent: Please be advised that RX refills may take up to 48 hours. We ask that you follow-up with your pharmacy.

## 2017-04-14 NOTE — Telephone Encounter (Signed)
Patient states he has been taking the Remeron for 3 weeks now. He states it doesn't help him go to sleep and he is groggy the next morning. He states he takes the medication 45 min to 60 min before bedtime- he usually goes to bed at 11-12 and wakes around 2:30. Patient states he fell asleep better with the Ambien- but it only lasted 2 hours and he was waking up.  Patient is willing to try the extended or something else- whatever Dr Clent RidgesFry suggest.

## 2017-04-14 NOTE — Telephone Encounter (Signed)
Stop the Remeron. Call in Ambien CR 12.5 mg qhs, #30 with no rf

## 2017-04-15 MED ORDER — ZOLPIDEM TARTRATE ER 12.5 MG PO TBCR: 12.5000 mg | EXTENDED_RELEASE_TABLET | Freq: Every evening | ORAL | 0 refills | Status: DC | PRN

## 2017-04-15 NOTE — Telephone Encounter (Signed)
Remeron removed for pt medication list. Rx call in for Ambien. Spoke with pt. Pt advised and voiced understanding.

## 2017-04-20 ENCOUNTER — Ambulatory Visit (INDEPENDENT_AMBULATORY_CARE_PROVIDER_SITE_OTHER): Payer: BLUE CROSS/BLUE SHIELD | Admitting: Family Medicine

## 2017-04-20 ENCOUNTER — Encounter: Payer: Self-pay | Admitting: Family Medicine

## 2017-04-20 VITALS — BP 120/78 | HR 73 | Temp 98.2°F | Ht 67.0 in | Wt 179.8 lb

## 2017-04-20 DIAGNOSIS — Z0000 Encounter for general adult medical examination without abnormal findings: Secondary | ICD-10-CM | POA: Diagnosis not present

## 2017-04-20 LAB — CBC WITH DIFFERENTIAL/PLATELET
Basophils Absolute: 0.1 10*3/uL (ref 0.0–0.1)
Basophils Relative: 0.8 % (ref 0.0–3.0)
Eosinophils Absolute: 0.1 10*3/uL (ref 0.0–0.7)
Eosinophils Relative: 0.9 % (ref 0.0–5.0)
HEMATOCRIT: 47.5 % (ref 39.0–52.0)
Hemoglobin: 15.9 g/dL (ref 13.0–17.0)
LYMPHS PCT: 18.9 % (ref 12.0–46.0)
Lymphs Abs: 2.4 10*3/uL (ref 0.7–4.0)
MCHC: 33.5 g/dL (ref 30.0–36.0)
MCV: 95.8 fl (ref 78.0–100.0)
MONOS PCT: 7.1 % (ref 3.0–12.0)
Monocytes Absolute: 0.9 10*3/uL (ref 0.1–1.0)
NEUTROS ABS: 9.2 10*3/uL — AB (ref 1.4–7.7)
Neutrophils Relative %: 72.3 % (ref 43.0–77.0)
PLATELETS: 396 10*3/uL (ref 150.0–400.0)
RBC: 4.96 Mil/uL (ref 4.22–5.81)
RDW: 13.4 % (ref 11.5–15.5)
WBC: 12.7 10*3/uL — ABNORMAL HIGH (ref 4.0–10.5)

## 2017-04-20 LAB — LIPID PANEL
CHOL/HDL RATIO: 2
Cholesterol: 220 mg/dL — ABNORMAL HIGH (ref 0–200)
HDL: 90.9 mg/dL (ref >39.00)
LDL Cholesterol: 115 mg/dL — ABNORMAL HIGH (ref 0–99)
NONHDL: 129.12
TRIGLYCERIDES: 70 mg/dL (ref 0.0–149.0)
VLDL: 14 mg/dL (ref 0.0–40.0)

## 2017-04-20 LAB — PSA: PSA: 0.54 ng/mL (ref 0.10–4.00)

## 2017-04-20 LAB — BASIC METABOLIC PANEL
BUN: 8 mg/dL (ref 6–23)
CHLORIDE: 101 meq/L (ref 96–112)
CO2: 27 mEq/L (ref 19–32)
Calcium: 10 mg/dL (ref 8.4–10.5)
Creatinine, Ser: 0.82 mg/dL (ref 0.40–1.50)
GFR: 112.89 mL/min (ref >60.00)
Glucose, Bld: 95 mg/dL (ref 70–99)
POTASSIUM: 5 meq/L (ref 3.5–5.1)
Sodium: 140 mEq/L (ref 135–145)

## 2017-04-20 LAB — HEPATIC FUNCTION PANEL
ALT: 12 U/L (ref 0–53)
AST: 18 U/L (ref 0–37)
Albumin: 5 g/dL (ref 3.5–5.2)
Alkaline Phosphatase: 78 U/L (ref 39–117)
BILIRUBIN TOTAL: 0.8 mg/dL (ref 0.2–1.2)
Bilirubin, Direct: 0.1 mg/dL (ref 0.0–0.3)
Total Protein: 7.7 g/dL (ref 6.0–8.3)

## 2017-04-20 LAB — POC URINALSYSI DIPSTICK (AUTOMATED)
BILIRUBIN UA: NEGATIVE
Blood, UA: NEGATIVE
GLUCOSE UA: NEGATIVE
Ketones, UA: NEGATIVE
LEUKOCYTES UA: NEGATIVE
NITRITE UA: NEGATIVE
Protein, UA: NEGATIVE
Spec Grav, UA: >=1.03 — AB (ref 1.010–1.025)
Urobilinogen, UA: 0.2 E.U./dL
pH, UA: 6 (ref 5.0–8.0)

## 2017-04-20 LAB — TSH: TSH: 1.23 u[IU]/mL (ref 0.35–4.50)

## 2017-04-20 MED ORDER — TAMSULOSIN HCL 0.4 MG PO CAPS: 0.4000 mg | ORAL_CAPSULE | Freq: Every day | ORAL | 3 refills | Status: DC

## 2017-04-20 NOTE — Progress Notes (Signed)
   Subjective:    Patient ID: Ryan Olsen, male    DOB: 07/09/1980, 36 y.o.   MRN: [id number]  HPI Here for a well exam. He is sleeping better on Ambien. His main complaint is trouble passing his urine. There is no discomfort. For several years he has had frequent urinations but he passes only a small amount at a time.    Review of Systems  Constitutional: Negative.   HENT: Negative.   Eyes: Negative.   Respiratory: Negative.   Cardiovascular: Negative.   Gastrointestinal: Negative.   Genitourinary: Positive for difficulty urinating and frequency. Negative for discharge, dysuria, enuresis, flank pain, hematuria, scrotal swelling, testicular pain and urgency.  Musculoskeletal: Negative.   Skin: Negative.   Neurological: Negative.   Psychiatric/Behavioral: Negative.        Objective:   Physical Exam  Constitutional: He is oriented to person, place, and time. He appears well-developed and well-nourished. No distress.  HENT:  Head: Normocephalic and atraumatic.  Right Ear: External ear normal.  Left Ear: External ear normal.  Nose: Nose normal.  Mouth/Throat: Oropharynx is clear and moist. No oropharyngeal exudate.  Eyes: Conjunctivae and EOM are normal. Pupils are equal, round, and reactive to light. Right eye exhibits no discharge. Left eye exhibits no discharge. No scleral icterus.  Neck: Neck supple. No JVD present. No tracheal deviation present. No thyromegaly present.  Cardiovascular: Normal rate, regular rhythm, normal heart sounds and intact distal pulses. Exam reveals no gallop and no friction rub.  No murmur heard. Pulmonary/Chest: Effort normal and breath sounds normal. No respiratory distress. He has no wheezes. He has no rales. He exhibits no tenderness.  Abdominal: Soft. Bowel sounds are normal. He exhibits no distension and no mass. There is no tenderness. There is no rebound and no guarding.  Genitourinary: Rectum normal, prostate normal and penis normal. Rectal  exam shows guaiac negative stool. No penile tenderness.  Musculoskeletal: Normal range of motion. He exhibits no edema or tenderness.  Lymphadenopathy:    He has no cervical adenopathy.  Neurological: He is alert and oriented to person, place, and time. He has normal reflexes. No cranial nerve deficit. He exhibits normal muscle tone. Coordination normal.  Skin: Skin is warm and dry. No rash noted. He is not diaphoretic. No erythema. No pallor.  Psychiatric: He has a normal mood and affect. His behavior is normal. Judgment and thought content normal.          Assessment & Plan:  Well exam. We discussed diet and exercise. Get fasting labs. It sounds like he has some BPH, so he will try Flomax 0.4 mg daily.  Gershon CraneStephen Fry, MD

## 2017-05-14 ENCOUNTER — Other Ambulatory Visit: Payer: Self-pay | Admitting: Family Medicine

## 2017-05-15 NOTE — Telephone Encounter (Signed)
Called pt and left a VM that PC is out of the office but will be back on Monday. Sent to PCP for approval.

## 2017-05-15 NOTE — Telephone Encounter (Signed)
Last OV 04/20/2017. Rx was last refilled 04/15/2017 disp 30 with no refills.

## 2017-05-18 ENCOUNTER — Telehealth: Payer: Self-pay | Admitting: Family Medicine

## 2017-05-18 NOTE — Telephone Encounter (Signed)
Copied from CRM [id number]. Topic: Quick Communication - See Telephone Encounter >> May 18, 2017 10:00 AM [name], [name], NT wrote: CRM for notification. See Telephone encounter for: Patient calling because he still needs a refill on his Zolpidem. If someone could give him a call back about this at [phone]  05/18/17.

## 2017-05-18 NOTE — Telephone Encounter (Signed)
Call in #30 with 5 rf 

## 2017-05-18 NOTE — Telephone Encounter (Signed)
Chart up to date. Request Zolpidem. Thanks.

## 2017-05-20 NOTE — Telephone Encounter (Signed)
Rx was called into pharmacy today. Called pt and left a VM that Rx was sent into pharmacy today.

## 2017-05-25 ENCOUNTER — Encounter: Payer: Self-pay | Admitting: Family Medicine

## 2017-05-25 ENCOUNTER — Ambulatory Visit: Payer: BLUE CROSS/BLUE SHIELD | Admitting: Family Medicine

## 2017-05-25 VITALS — BP 130/70 | HR 88 | Temp 97.8°F | Wt 183.0 lb

## 2017-05-25 DIAGNOSIS — F5101 Primary insomnia: Secondary | ICD-10-CM | POA: Diagnosis not present

## 2017-05-25 DIAGNOSIS — N138 Other obstructive and reflux uropathy: Secondary | ICD-10-CM | POA: Diagnosis not present

## 2017-05-25 DIAGNOSIS — N401 Enlarged prostate with lower urinary tract symptoms: Secondary | ICD-10-CM | POA: Diagnosis not present

## 2017-05-25 MED ORDER — CLONAZEPAM 1 MG PO TABS
1.0000 mg | ORAL_TABLET | Freq: Every day | ORAL | 2 refills | Status: DC
Start: 2017-05-25 — End: 2017-08-06

## 2017-05-25 NOTE — Progress Notes (Signed)
   Subjective:    Patient ID: Ryan Olsen, male    DOB: 11/06/1980, 37 y.o.   MRN: [id number]  HPI Here for several issues. First he still has trouble urinating and he feels as if he cannot empty his bladder. Flomax has not helped at all. No burning or pain. Also he still cannot sleep. He feels like his mind is racing at night and he cannot "turn it off". He is currently on Wellbutrin in the mornings and Gabapentin TID. He most recently tried Ambien CR and it helps for a few hours but as usual he wakes up during the night. He has been diagnosed with both Bipolar Depression and with Unipolar Depression in the past. He has tried Temazepam, Lamictal, Seroquel, Effexor, and lithium but nothing has ever worked very well. It sounds like he has not tried benzodiazepines yet. His mother actually found a psychiatrist for him to see and he will see them in about 2 weeks.    Review of Systems  Constitutional: Negative.   Respiratory: Negative.   Cardiovascular: Negative.   Genitourinary: Positive for difficulty urinating. Negative for discharge, dysuria, frequency and testicular pain.  Neurological: Negative.   Psychiatric/Behavioral: Positive for agitation, decreased concentration, dysphoric mood and sleep disturbance. Negative for hallucinations, self-injury and suicidal ideas. The patient is nervous/anxious.        Objective:   Physical Exam  Constitutional: He is oriented to person, place, and time. He appears well-developed and well-nourished.  Cardiovascular: Normal rate, regular rhythm, normal heart sounds and intact distal pulses.  Pulmonary/Chest: Effort normal and breath sounds normal. No respiratory distress. He has no wheezes. He has no rales.  Abdominal: Soft. Bowel sounds are normal. He exhibits no distension and no mass. There is no tenderness. There is no rebound and no guarding.  Neurological: He is alert and oriented to person, place, and time.  Psychiatric: His behavior is  normal. Judgment and thought content normal.  Very anxious but appropriate           Assessment & Plan:  For the difficulty urinating we will refer him to Urology. Stop the Flomax. For the insomnia, I think this plays into his overall anxiety picture. Try Clonazepam 1 mg qhs. He will see Psychiatry in 2 weeks.  Gershon CraneStephen Tan Clopper, MD

## 2017-06-09 DIAGNOSIS — F411 Generalized anxiety disorder: Secondary | ICD-10-CM | POA: Diagnosis not present

## 2017-06-09 DIAGNOSIS — Z79899 Other long term (current) drug therapy: Secondary | ICD-10-CM | POA: Diagnosis not present

## 2017-06-09 DIAGNOSIS — F3181 Bipolar II disorder: Secondary | ICD-10-CM | POA: Diagnosis not present

## 2017-06-12 DIAGNOSIS — F332 Major depressive disorder, recurrent severe without psychotic features: Secondary | ICD-10-CM | POA: Diagnosis not present

## 2017-06-16 ENCOUNTER — Ambulatory Visit: Payer: BLUE CROSS/BLUE SHIELD | Admitting: Family Medicine

## 2017-06-16 ENCOUNTER — Encounter (HOSPITAL_COMMUNITY): Payer: Self-pay | Admitting: *Deleted

## 2017-06-16 ENCOUNTER — Encounter: Payer: Self-pay | Admitting: Family Medicine

## 2017-06-16 ENCOUNTER — Emergency Department (HOSPITAL_COMMUNITY): Payer: BLUE CROSS/BLUE SHIELD

## 2017-06-16 ENCOUNTER — Other Ambulatory Visit: Payer: Self-pay

## 2017-06-16 ENCOUNTER — Emergency Department (HOSPITAL_COMMUNITY)
Admission: EM | Admit: 2017-06-16 | Discharge: 2017-06-16 | Disposition: A | Discharge status: Home / Self Care | Payer: BLUE CROSS/BLUE SHIELD | Attending: Emergency Medicine | Admitting: Emergency Medicine

## 2017-06-16 VITALS — BP 135/90 | HR 92 | Temp 97.9°F | Resp 16 | Ht 67.0 in | Wt 174.6 lb

## 2017-06-16 DIAGNOSIS — M25532 Pain in left wrist: Secondary | ICD-10-CM

## 2017-06-16 DIAGNOSIS — S0181XA Laceration without foreign body of other part of head, initial encounter: Secondary | ICD-10-CM | POA: Insufficient documentation

## 2017-06-16 DIAGNOSIS — Z79899 Other long term (current) drug therapy: Secondary | ICD-10-CM | POA: Diagnosis not present

## 2017-06-16 DIAGNOSIS — S199XXA Unspecified injury of neck, initial encounter: Secondary | ICD-10-CM | POA: Diagnosis not present

## 2017-06-16 DIAGNOSIS — S0990XA Unspecified injury of head, initial encounter: Secondary | ICD-10-CM | POA: Insufficient documentation

## 2017-06-16 DIAGNOSIS — I10 Essential (primary) hypertension: Secondary | ICD-10-CM | POA: Diagnosis not present

## 2017-06-16 DIAGNOSIS — S6392XA Sprain of unspecified part of left wrist and hand, initial encounter: Secondary | ICD-10-CM

## 2017-06-16 DIAGNOSIS — F1910 Other psychoactive substance abuse, uncomplicated: Secondary | ICD-10-CM | POA: Diagnosis not present

## 2017-06-16 DIAGNOSIS — Y92410 Unspecified street and highway as the place of occurrence of the external cause: Secondary | ICD-10-CM | POA: Insufficient documentation

## 2017-06-16 DIAGNOSIS — Y939 Activity, unspecified: Secondary | ICD-10-CM | POA: Insufficient documentation

## 2017-06-16 DIAGNOSIS — S069X9A Unspecified intracranial injury with loss of consciousness of unspecified duration, initial encounter: Secondary | ICD-10-CM | POA: Diagnosis not present

## 2017-06-16 DIAGNOSIS — S6992XD Unspecified injury of left wrist, hand and finger(s), subsequent encounter: Secondary | ICD-10-CM | POA: Diagnosis not present

## 2017-06-16 DIAGNOSIS — S0081XA Abrasion of other part of head, initial encounter: Secondary | ICD-10-CM | POA: Diagnosis not present

## 2017-06-16 DIAGNOSIS — F17200 Nicotine dependence, unspecified, uncomplicated: Secondary | ICD-10-CM | POA: Insufficient documentation

## 2017-06-16 DIAGNOSIS — S8991XD Unspecified injury of right lower leg, subsequent encounter: Secondary | ICD-10-CM | POA: Diagnosis not present

## 2017-06-16 DIAGNOSIS — S6990XA Unspecified injury of unspecified wrist, hand and finger(s), initial encounter: Secondary | ICD-10-CM | POA: Diagnosis not present

## 2017-06-16 DIAGNOSIS — T148XXA Other injury of unspecified body region, initial encounter: Secondary | ICD-10-CM | POA: Diagnosis not present

## 2017-06-16 DIAGNOSIS — Y998 Other external cause status: Secondary | ICD-10-CM | POA: Diagnosis not present

## 2017-06-16 DIAGNOSIS — R030 Elevated blood-pressure reading, without diagnosis of hypertension: Secondary | ICD-10-CM

## 2017-06-16 DIAGNOSIS — S6992XA Unspecified injury of left wrist, hand and finger(s), initial encounter: Secondary | ICD-10-CM | POA: Diagnosis not present

## 2017-06-16 DIAGNOSIS — M79642 Pain in left hand: Secondary | ICD-10-CM | POA: Diagnosis not present

## 2017-06-16 MED ORDER — ACETAMINOPHEN 325 MG PO TABS
650.0000 mg | ORAL_TABLET | Freq: Once | ORAL | Status: AC
  Administered 2017-06-16: 650 mg via ORAL
  Filled 2017-06-16: qty 2

## 2017-06-16 MED ORDER — TETANUS-DIPHTH-ACELL PERTUSSIS 5-2.5-18.5 LF-MCG/0.5 IM SUSP
0.5000 mL | Freq: Once | INTRAMUSCULAR | Status: DC
  Filled 2017-06-16: qty 0.5

## 2017-06-16 MED ORDER — CELECOXIB 100 MG PO CAPS: 100.0000 mg | ORAL_CAPSULE | Freq: Two times a day (BID) | ORAL | 0 refills | Status: AC

## 2017-06-16 MED ORDER — LIDOCAINE-EPINEPHRINE (PF) 2 %-1:200000 IJ SOLN
10.0000 mL | Freq: Once | INTRAMUSCULAR | Status: AC
  Administered 2017-06-16: 10 mL via INTRADERMAL
  Filled 2017-06-16: qty 20
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
## 2017-06-16 NOTE — ED Notes (Signed)
Pt given d/c instructions and departed in NAD, in custody of [employer] officers.

## 2017-06-16 NOTE — ED Notes (Signed)
Pt states that he took 10 [employer] Schwabambien tonight, in addition to two "20oz beers", "smoking some dope and crack". When asked he clarified "dope" as heroin.

## 2017-06-16 NOTE — Patient Instructions (Signed)
  [name] I have seen you today for an acute visit.  A few things to remember from today's visit:   Injury of left hand, subsequent encounter - Plan: celecoxib (CELEBREX) 100 MG capsule  Injury of right knee, subsequent encounter - Plan: DG Knee Complete 4 Views Right   Medications prescribed today are intended for short period of time and will not be refill upon request, a follow up appointment might be necessary to discuss continuation of of treatment if appropriate.   Celebrex can be taken twice daily as needed. You can also take over-the-counter Tylenol 500 mg 4 times per day. Avoid over-the-counter ibuprofen or naproxen because these medications similar to Celebrex. Monitor your blood pressure periodically. Apply local ice.  Today X ray was ordered.  This can be done at [facility] between 8 am and 5 pm: [address] [phone].    In general please monitor for signs of worsening symptoms and seek immediate medical attention if any concerning.  If symptoms are not resolved in 2 weeks you should schedule a follow up appointment with your doctor, before if needed.  I hope you get better soon!
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
## 2017-06-16 NOTE — ED Notes (Signed)
Patient transported to CT 
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
## 2017-08-06 ENCOUNTER — Other Ambulatory Visit: Payer: Self-pay | Admitting: Family Medicine

## 2017-08-07 NOTE — Telephone Encounter (Signed)
Call in #30 with 5 rf 

## 2017-08-07 NOTE — Telephone Encounter (Signed)
Last OV 06/16/2017   Last refilled 05/25/2017 disp 30 with 2 refills    Sent to PCP for approval

## 2017-08-10 ENCOUNTER — Ambulatory Visit: Payer: BLUE CROSS/BLUE SHIELD | Admitting: Family Medicine

## 2017-08-10 ENCOUNTER — Telehealth: Payer: Self-pay | Admitting: Family Medicine

## 2017-08-10 NOTE — Telephone Encounter (Signed)
Copied from CRM [id number]. Topic: General - Other >> Aug 10, 2017  8:45 AM Ryan Olsen, Ryan Olsen, RMA wrote: Reason for CRM: Medication refill request for clonazePAM (KLONOPIN) 1 MG [facility] [address], pt is completely out of medication

## 2017-08-10 NOTE — Telephone Encounter (Signed)
Called and spoke with pt. Pt advised Rx was called in today.

## 2017-08-19 ENCOUNTER — Telehealth: Payer: Self-pay | Admitting: Family Medicine

## 2017-08-19 NOTE — Telephone Encounter (Signed)
Sent to PCP ?

## 2017-08-19 NOTE — Telephone Encounter (Signed)
The letter is ready to fax  

## 2017-08-19 NOTE — Telephone Encounter (Signed)
Copied from CRM [phone]. Topic: Quick Communication - See Telephone Encounter >> Aug 19, 2017  1:35 PM [name], Sunya Humbarger, [place] wrote: CRM for notification. Patient calling because he would like something from the doctor stated that he is on Wellbutrin 300 mg, Klonopin 1 mg, and Prilosec 20 mg. Stated that he needs it for court to prove that he is on the medications.He needs to have this paper work to his lawyer by Friday He would like to have that e-mailed to him at [email or web address]. If anyone as any further questions he can be reached at [phone]

## 2017-08-20 NOTE — Telephone Encounter (Signed)
Called pt and left a VM that letter is ready for either pick up or we can fax it will need a fax number if he wishes for this to be faxed. We are unable to e-mail this to the pt. Advised pt to call us back to let know what he wishes to do.

## 2017-08-20 NOTE — Telephone Encounter (Signed)
Pt would like you to fax to:  [phone] Attn:  Salvadore FarberJannell Curry

## 2017-08-21 NOTE — Telephone Encounter (Signed)
Letter has been faxed as requested.

## 2017-09-29 ENCOUNTER — Other Ambulatory Visit: Payer: Self-pay | Admitting: Family Medicine

## 2017-09-29 NOTE — Telephone Encounter (Signed)
Refill request for Gabapentin, last filled on 04/02/17 by historical provider.   LOV: 05/25/17 Dr. Barney Drain on [address]

## 2017-09-29 NOTE — Telephone Encounter (Signed)
Copied from CRM [id number]. Topic: Quick Communication - Rx Refill/Question >> Sep 29, 2017 12:00 PM Stovall, Shana A wrote: Medication: gabapentin (NEURONTIN) 600 MG tablet [[id number]]  Has the patient contacted their pharmacy? No  (Agent: If no, request that the patient contact the pharmacy for the refill.) Preferred Pharmacy (with phone number or street name): [facility] on [address]  Agent: Please be advised that RX refills may take up to 3 business days. We ask that you follow-up with your pharmacy.

## 2017-09-29 NOTE — Telephone Encounter (Signed)
Please Advise

## 2017-09-30 MED ORDER — GABAPENTIN 600 MG PO TABS: 600.0000 mg | ORAL_TABLET | Freq: Three times a day (TID) | ORAL | 5 refills | Status: DC

## 2017-09-30 NOTE — Telephone Encounter (Signed)
Patient is saying that the pharmacist does not have this prescription. Would like to know what he should do.

## 2017-09-30 NOTE — Telephone Encounter (Signed)
Change the Gabapentin to 300 mg to take TID, call in #90 with no rf

## 2017-09-30 NOTE — Telephone Encounter (Signed)
His has been called into the pharmacy.

## 2017-09-30 NOTE — Telephone Encounter (Signed)
Called the [facility] and they stated that they have already spoken to the patient to let him know that his last Rx for Gabapentin 600 mg came from a different provider and was filled on 07/18/2017 and patient is not able to refill until 10/18/17 per pharmacy records.  Called patient and he stated that he is moving and he is not able to locate the Gabapentin 600 mg and wants to know if he can get an Rx for the Gabapentin 300 mg sent in so that he can have something to help him with his discomfort.  Please advise.

## 2017-09-30 NOTE — Telephone Encounter (Signed)
Done

## 2017-10-05 ENCOUNTER — Ambulatory Visit (INDEPENDENT_AMBULATORY_CARE_PROVIDER_SITE_OTHER): Payer: BLUE CROSS/BLUE SHIELD | Admitting: Family Medicine

## 2017-10-05 ENCOUNTER — Encounter: Payer: Self-pay | Admitting: Family Medicine

## 2017-10-05 VITALS — BP 130/96 | HR 90 | Temp 98.2°F | Wt 177.0 lb

## 2017-10-05 DIAGNOSIS — N401 Enlarged prostate with lower urinary tract symptoms: Secondary | ICD-10-CM

## 2017-10-05 DIAGNOSIS — N138 Other obstructive and reflux uropathy: Secondary | ICD-10-CM | POA: Diagnosis not present

## 2017-10-05 DIAGNOSIS — B349 Viral infection, unspecified: Secondary | ICD-10-CM | POA: Diagnosis not present

## 2017-10-05 DIAGNOSIS — G4700 Insomnia, unspecified: Secondary | ICD-10-CM

## 2017-10-05 MED ORDER — TEMAZEPAM 30 MG PO CAPS: 30.0000 mg | ORAL_CAPSULE | Freq: Every evening | ORAL | 2 refills | Status: DC | PRN

## 2017-10-05 NOTE — Progress Notes (Signed)
   Subjective:    Patient ID: Ryan Olsen, male    DOB: March 27, 1981, 37 y.o.   MRN: [id number]  HPI Here for 2 issues. First he still has trouble sleeping and he has mixed results with Ambien. He wants to try Temazepam. Also yesterday morning he developed shaking chills and a fever up to 102 degrees. He has had body aches and diarrhea as well. No ST or cough. No vomiting. Drinking fluids.    Review of Systems  Constitutional: Positive for fever.  HENT: Negative.   Eyes: Negative.   Respiratory: Negative.   Cardiovascular: Negative.   Gastrointestinal: Positive for diarrhea. Negative for abdominal distention, abdominal pain, anal bleeding, blood in stool, constipation and nausea.  Genitourinary: Negative.        Objective:   Physical Exam  Constitutional: He appears well-developed and well-nourished. No distress.  HENT:  Right Ear: External ear normal.  Left Ear: External ear normal.  Nose: Nose normal.  Mouth/Throat: Oropharynx is clear and moist.  Eyes: Conjunctivae are normal.  Neck: No thyromegaly present.  Cardiovascular: Normal rate, regular rhythm, normal heart sounds and intact distal pulses.  Pulmonary/Chest: Effort normal and breath sounds normal. No stridor. No respiratory distress. He has no wheezes. He has no rales.  Abdominal: Soft. Bowel sounds are normal. He exhibits no distension and no mass. There is no tenderness. There is no rebound and no guarding. No hernia.  Lymphadenopathy:    He has no cervical adenopathy.          Assessment & Plan:  He has a viral illness and this should pass in another day or so. Drink fluids, use Ibuprofen for aches and fever. Use Imodium prn for diarrhea. As for insomnia, try Temazepam 30 mg qhs.  Gershon Crane, MD

## 2017-10-08 ENCOUNTER — Telehealth: Payer: Self-pay | Admitting: Family Medicine

## 2017-10-09 NOTE — Telephone Encounter (Signed)
Call in #30 with 5 rf 

## 2017-10-09 NOTE — Telephone Encounter (Signed)
Last OV 10/05/2017   Medication is NOT listed on pt current medication list   Sent to PCP to advise

## 2017-10-22 MED ORDER — ZOLPIDEM TARTRATE 10 MG PO TABS: 10.0000 mg | ORAL_TABLET | Freq: Every evening | ORAL | 5 refills | Status: DC | PRN

## 2017-10-22 NOTE — Telephone Encounter (Signed)
Sent to PCP for approval   Pt refill request was for ER 12.5 MG and now the pt is wanting the regular release 10 MG due to cost reasons

## 2017-10-22 NOTE — Addendum Note (Signed)
Addended by: Gracelyn NurseBLACKWELL, Danford Tat P on: 10/22/2017 11:04 AM   Modules accepted: Orders

## 2017-10-22 NOTE — Telephone Encounter (Signed)
Cancel the Zolpidem ER and call in Zolpidem 10 mg to take qhs, #30 with 5 rf

## 2017-10-22 NOTE — Telephone Encounter (Signed)
Called the pharmacy and had them remove ambien ER 12.5 MG and called in regular ambien 10 MG.    Called and spoke with pt. Pt advised and voiced understanding.

## 2017-10-22 NOTE — Telephone Encounter (Signed)
Patient calling back and would like Regular 10mg , not the extended release, cost more. Please advise. Call back [phone]

## 2017-10-25 ENCOUNTER — Other Ambulatory Visit: Payer: Self-pay | Admitting: Family Medicine

## 2017-11-02 ENCOUNTER — Telehealth: Payer: Self-pay | Admitting: Family Medicine

## 2017-11-02 ENCOUNTER — Other Ambulatory Visit: Payer: Self-pay | Admitting: Family Medicine

## 2017-11-02 ENCOUNTER — Other Ambulatory Visit: Payer: Self-pay

## 2017-11-02 MED ORDER — OMEPRAZOLE 20 MG PO CPDR: 20.0000 mg | DELAYED_RELEASE_CAPSULE | Freq: Every day | ORAL | 5 refills | Status: DC

## 2017-11-02 NOTE — Telephone Encounter (Signed)
Last OV 10/05/2017   Last refilled 08/10/2017 disp 30 with 5 refills   [facility] or [facility] for dose increase

## 2017-11-02 NOTE — Telephone Encounter (Signed)
Copied from CRM [phone]. Topic: General - Other >> Nov 02, 2017  9:36 AM Stephannie LiSimmons, Ulonda Klosowski L, NT wrote: Reason for EAV:[id number]:Patient called and said the   clonazePAM (KLONOPIN) 1 MG tablet is no longer working for him ,he is taking 2 at a time , can his dose be increased please advise , [phone] Please send to  [phone] [place] - [address] [facility] [address] [phone] (Phone) [phone] (Fax

## 2017-11-02 NOTE — Telephone Encounter (Signed)
Last OV   Temazepam 10/05/2017 disp 30 with 2 refills   Omeprazole 03/23/2017 disp 30 with 5 refills

## 2017-11-03 MED ORDER — CLONAZEPAM 2 MG PO TABS: 2.0000 mg | ORAL_TABLET | Freq: Every day | ORAL | 5 refills | Status: DC

## 2017-11-03 NOTE — Telephone Encounter (Signed)
Change the Clonazepam to 2 mg to take qhs as needed, call in #30 with 5 rf

## 2017-11-03 NOTE — Telephone Encounter (Signed)
I have phoned in prescription to pharmacy. Left message on patients phone that prescription has been sent to the pharmacy.

## 2017-11-23 ENCOUNTER — Telehealth: Payer: Self-pay | Admitting: Family Medicine

## 2017-11-23 ENCOUNTER — Other Ambulatory Visit: Payer: Self-pay | Admitting: Family Medicine

## 2017-11-23 NOTE — Telephone Encounter (Signed)
Copied from CRM [id number]. Topic: Quick Communication - Rx Refill/Question >> Nov 23, 2017  9:16 AM Ryan DankerWeikart, Melissa J wrote: Medication: buPROPion (WELLBUTRIN XL) 300 MG 24 hr tablet  Has the patient contacted their pharmacy? No. (Agent: If no, request that the patient contact the pharmacy for the refill.) (Agent: If yes, when and what did the pharmacy advise?) says that they cancelled it out   Preferred Pharmacy (with phone number or street name): [facility] [address]  Pt said he stopped meds and now would like to re-start. He says that the pharmacy cancelled out the rx.  Agent: Please be advised that RX refills may take up to 3 business days. We ask that you follow-up with your pharmacy.

## 2017-11-23 NOTE — Telephone Encounter (Signed)
Medication filled on 11/23/17 

## 2017-11-30 ENCOUNTER — Other Ambulatory Visit: Payer: Self-pay | Admitting: Family Medicine

## 2017-12-01 NOTE — Telephone Encounter (Signed)
Last OV 10/05/2017   Last refilled

## 2017-12-02 ENCOUNTER — Other Ambulatory Visit: Payer: Self-pay | Admitting: Family Medicine

## 2017-12-02 NOTE — Telephone Encounter (Signed)
Sent to PCP for approval   Pt will have an Rx to put up this month on the 20th but will be out of refills after that

## 2017-12-02 NOTE — Telephone Encounter (Signed)
Last OV 10/05/2017   Last refilled 10/05/2017 disp 30 with 2 refills

## 2017-12-03 NOTE — Telephone Encounter (Signed)
This is too far out. He should call back closer to 01-05-18

## 2017-12-20 ENCOUNTER — Other Ambulatory Visit: Payer: Self-pay | Admitting: Family Medicine

## 2017-12-22 ENCOUNTER — Ambulatory Visit (INDEPENDENT_AMBULATORY_CARE_PROVIDER_SITE_OTHER): Payer: BLUE CROSS/BLUE SHIELD | Admitting: Family Medicine

## 2017-12-22 ENCOUNTER — Encounter: Payer: Self-pay | Admitting: Family Medicine

## 2017-12-22 ENCOUNTER — Ambulatory Visit: Payer: BLUE CROSS/BLUE SHIELD | Admitting: Family Medicine

## 2017-12-22 VITALS — BP 138/90 | HR 80 | Temp 97.7°F | Wt 168.8 lb

## 2017-12-22 DIAGNOSIS — F321 Major depressive disorder, single episode, moderate: Secondary | ICD-10-CM | POA: Diagnosis not present

## 2017-12-22 MED ORDER — BUPROPION HCL ER (XL) 150 MG PO TB24
150.0000 mg | ORAL_TABLET | Freq: Every day | ORAL | 2 refills | Status: DC
Start: 2017-12-22 — End: 2018-05-27

## 2017-12-22 MED ORDER — TEMAZEPAM 30 MG PO CAPS: 30.0000 mg | ORAL_CAPSULE | Freq: Every evening | ORAL | 2 refills | Status: DC | PRN

## 2017-12-22 MED ORDER — BUPROPION HCL ER (XL) 300 MG PO TB24: 300.0000 mg | ORAL_TABLET | Freq: Every day | ORAL | 2 refills | Status: DC

## 2017-12-22 NOTE — Progress Notes (Signed)
   Subjective:    Patient ID: Ryan Olsen, male    DOB: 01/06/1981, 37 y.o.   MRN: [id number]  HPI Here to follow up on depression. He has been taking Wellbutrin XL 300 mg daily for about a year. It seemed to help at first but now it does not work as well. He has tried any number of SSRI's and other antidepressants over the years and none of them helped. Of note he has an appt to see Dr. Milagros Evenerupinder Kaur in October and they have talked about the possibility of trying nasal Ketamine. He sleeps well on Temazepam.    Review of Systems  Constitutional: Negative.   Respiratory: Negative.   Cardiovascular: Negative.   Psychiatric/Behavioral: Positive for dysphoric mood. Negative for agitation, confusion and hallucinations. The patient is nervous/anxious.        Objective:   Physical Exam  Constitutional: He is oriented to person, place, and time. He appears well-developed and well-nourished.  Cardiovascular: Normal rate, regular rhythm, normal heart sounds and intact distal pulses.  Pulmonary/Chest: Effort normal and breath sounds normal.  Neurological: He is alert and oriented to person, place, and time.  Psychiatric: He has a normal mood and affect. His behavior is normal. Thought content normal.          Assessment & Plan:  For his depression we will try increasing the Wellbutrin XL to a total of 450 mg each morning. He will see Dr. Evelene CroonKaur as above and she will take over the care of this problem.  Gershon CraneStephen Fry, MD

## 2017-12-23 ENCOUNTER — Telehealth: Payer: Self-pay | Admitting: Family Medicine

## 2017-12-23 NOTE — Telephone Encounter (Signed)
Last OV 12/22/2017   Last refilled 08/10/2017 disp 30 with 5 refills   [facility]   * no [facility]*  Advised on the decrease of gabapentin dose sent to PCP to advise

## 2017-12-23 NOTE — Telephone Encounter (Signed)
Copied from CRM [id number]. Topic: Inquiry >> Dec 23, 2017  9:14 AM Alexander BergeronBarksdale, Harvey B wrote: Reason for CRM: pt called to speak w/ Mitzi DavenportShelby about medications that have been cancelled by his pharmacy, contact to advise

## 2017-12-23 NOTE — Telephone Encounter (Signed)
Pt requested a refill for Klonopin 1 mg, would [facility], per pt [facility] does not have any refills. Also pt wants to taper down the dose of Gabapentin, please call pt if and when approved.

## 2017-12-23 NOTE — Telephone Encounter (Signed)
Pt stated that he never did get the prescription sent in fo rthe lower dose of gabapentin for the 300 Mg dose  Plus pt need his xanax 1 Mg to be refilled   Last OV yesterday 12/22/2017   xanax 1 mg last refilled 07/21/2017 disp 30 with 5 refills   [facility]   Sent to PCP to advise

## 2017-12-24 NOTE — Telephone Encounter (Signed)
Pt checking status on message below

## 2017-12-25 MED ORDER — GABAPENTIN 300 MG PO CAPS: 300.0000 mg | ORAL_CAPSULE | Freq: Three times a day (TID) | ORAL | 5 refills | Status: DC

## 2017-12-25 NOTE — Telephone Encounter (Signed)
Pt aware of message below

## 2017-12-25 NOTE — Telephone Encounter (Signed)
Duplicated message.

## 2017-12-25 NOTE — Telephone Encounter (Signed)
He takes Clonazepam at bedtime, not Alprazolam, and he has plenty of refills. Decrease the Gabapentin to 300 mg tid, call in #90 with 5 rf

## 2017-12-25 NOTE — Telephone Encounter (Signed)
Prescription has been sent for gabapentin 300 MG call pt and left a VM to call back.   [facility]

## 2017-12-25 NOTE — Telephone Encounter (Signed)
Ok to close out message pt has been advised.

## 2017-12-29 ENCOUNTER — Encounter (HOSPITAL_COMMUNITY): Payer: Self-pay

## 2017-12-29 ENCOUNTER — Ambulatory Visit: Payer: Self-pay | Admitting: Family Medicine

## 2017-12-29 ENCOUNTER — Other Ambulatory Visit: Payer: Self-pay

## 2017-12-29 ENCOUNTER — Emergency Department (HOSPITAL_COMMUNITY)
Admission: EM | Admit: 2017-12-29 | Discharge: 2017-12-29 | Disposition: A | Discharge status: Home / Self Care | Payer: BLUE CROSS/BLUE SHIELD | Attending: Emergency Medicine | Admitting: Emergency Medicine

## 2017-12-29 DIAGNOSIS — Z79899 Other long term (current) drug therapy: Secondary | ICD-10-CM | POA: Insufficient documentation

## 2017-12-29 DIAGNOSIS — Y999 Unspecified external cause status: Secondary | ICD-10-CM | POA: Diagnosis not present

## 2017-12-29 DIAGNOSIS — T25221A Burn of second degree of right foot, initial encounter: Secondary | ICD-10-CM | POA: Diagnosis not present

## 2017-12-29 DIAGNOSIS — T25021A Burn of unspecified degree of right foot, initial encounter: Secondary | ICD-10-CM | POA: Diagnosis not present

## 2017-12-29 DIAGNOSIS — X118XXA Contact with other hot tap-water, initial encounter: Secondary | ICD-10-CM | POA: Insufficient documentation

## 2017-12-29 DIAGNOSIS — Y93G3 Activity, cooking and baking: Secondary | ICD-10-CM | POA: Insufficient documentation

## 2017-12-29 DIAGNOSIS — Z87891 Personal history of nicotine dependence: Secondary | ICD-10-CM | POA: Diagnosis not present

## 2017-12-29 DIAGNOSIS — Y9289 Other specified places as the place of occurrence of the external cause: Secondary | ICD-10-CM | POA: Diagnosis not present

## 2017-12-29 DIAGNOSIS — Z23 Encounter for immunization: Secondary | ICD-10-CM | POA: Insufficient documentation

## 2017-12-29 MED ORDER — TETANUS-DIPHTH-ACELL PERTUSSIS 5-2.5-18.5 LF-MCG/0.5 IM SUSP
0.5000 mL | Freq: Once | INTRAMUSCULAR | Status: AC
  Administered 2017-12-29: 0.5 mL via INTRAMUSCULAR
  Filled 2017-12-29: qty 0.5

## 2017-12-29 MED ORDER — SILVER SULFADIAZINE 1 % EX CREA: 1.0000 "application " | TOPICAL_CREAM | Freq: Two times a day (BID) | CUTANEOUS | 0 refills | Status: DC
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
## 2017-12-29 NOTE — Care Management (Signed)
ED CM reviewed for possible needs. Rubie Maidrystal Jossalin Chervenak RN CCM

## 2017-12-29 NOTE — Telephone Encounter (Signed)
Monitor for ER arrival 
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
## 2017-12-29 NOTE — Telephone Encounter (Signed)
I confirmed patient has arrived in the ER now.  

## 2018-01-05 ENCOUNTER — Other Ambulatory Visit: Payer: Self-pay | Admitting: Family Medicine

## 2018-01-06 NOTE — Telephone Encounter (Signed)
Not on current med list  Notes from 05/25/17 OV  Assessment & Plan:  For the difficulty urinating we will refer him to Urology. Stop the Flomax. For the insomnia, I think this plays into his overall anxiety picture. Try Clonazepam 1 mg qhs. He will see Psychiatry in 2 weeks.  Gershon CraneStephen Fry, MD   Ok to fill?

## 2018-01-08 ENCOUNTER — Telehealth: Payer: Self-pay | Admitting: *Deleted

## 2018-01-08 MED ORDER — GABAPENTIN 600 MG PO TABS: 600.0000 mg | ORAL_TABLET | Freq: Three times a day (TID) | ORAL | 5 refills | Status: DC

## 2018-01-08 NOTE — Telephone Encounter (Signed)
Copied from CRM [id number]. Topic: General - Other >> Jan 08, 2018 12:40 PM Mcneil, Ja-Kwan wrote: Reason for CRM: Pt states he was prescribed gabapentin (NEURONTIN) 300 MG capsule but previously he was on gabapentin 600 MG and now he is not getting the relief that he was having while on the gabapentin 600 MG capsules. Pt requests call back. Cb# [phone]

## 2018-01-08 NOTE — Telephone Encounter (Signed)
Change Gabapentin to 600 mg to take TID, call in #[phone] with 5 rf

## 2018-01-08 NOTE — Telephone Encounter (Signed)
Sent to PCP to advise   Last OV 12/22/2017   Notes from last OV: For his depression we will try increasing the Wellbutrin XL to a total of 450 mg each morning. He will see Dr. Evelene CroonKaur as above and she will take over the care of this problem.  Gershon CraneStephen Fry, MD

## 2018-01-08 NOTE — Telephone Encounter (Signed)
Called and spoke with pt. Pt advised and voiced understanding.  

## 2018-01-11 ENCOUNTER — Telehealth: Payer: Self-pay | Admitting: Family Medicine

## 2018-01-11 NOTE — Telephone Encounter (Signed)
Copied from CRM [id number]. Topic: Quick Communication - See Telephone Encounter >> Jan 11, 2018  5:29 PM Lorrine KinMcGee, Jaquia Benedicto B, [place] wrote: CRM for notification. See Telephone encounter for: 01/11/18. Patient calling and states that he is leaving out of town to [place] for the death of his grandfather. States that he will be leaving tonight to go out to [place]. Would like to know if Dr Clent RidgesFry could give permission for the pharmacy to fill his clonazePAM (KLONOPIN) 2 MG tablet  early or send/transfer it to a pharmacy in [place]. Please advise.

## 2018-01-12 NOTE — Telephone Encounter (Signed)
The pharmacy should be able to transfer this to a [facility] in [place]

## 2018-01-12 NOTE — Telephone Encounter (Signed)
Please advise 

## 2018-01-13 NOTE — Telephone Encounter (Signed)
Called pharmacy and was informed that the patient had picked up the prescription on 01/11/18. The pharmacist stated that if the patient was out of town for an extended period of time they should have no problem transferring the prescription.

## 2018-01-25 ENCOUNTER — Other Ambulatory Visit: Payer: Self-pay | Admitting: Family Medicine

## 2018-02-12 DIAGNOSIS — F900 Attention-deficit hyperactivity disorder, predominantly inattentive type: Secondary | ICD-10-CM | POA: Diagnosis not present

## 2018-02-18 DIAGNOSIS — F332 Major depressive disorder, recurrent severe without psychotic features: Secondary | ICD-10-CM | POA: Diagnosis not present

## 2018-02-19 DIAGNOSIS — F332 Major depressive disorder, recurrent severe without psychotic features: Secondary | ICD-10-CM | POA: Diagnosis not present

## 2018-02-20 DIAGNOSIS — F332 Major depressive disorder, recurrent severe without psychotic features: Secondary | ICD-10-CM | POA: Diagnosis not present

## 2018-02-25 DIAGNOSIS — F332 Major depressive disorder, recurrent severe without psychotic features: Secondary | ICD-10-CM | POA: Diagnosis not present

## 2018-02-26 DIAGNOSIS — F332 Major depressive disorder, recurrent severe without psychotic features: Secondary | ICD-10-CM | POA: Diagnosis not present

## 2018-02-27 DIAGNOSIS — F332 Major depressive disorder, recurrent severe without psychotic features: Secondary | ICD-10-CM | POA: Diagnosis not present

## 2018-02-28 ENCOUNTER — Other Ambulatory Visit: Payer: Self-pay | Admitting: Family Medicine

## 2018-03-04 ENCOUNTER — Telehealth: Payer: Self-pay | Admitting: Family Medicine

## 2018-03-04 NOTE — Telephone Encounter (Signed)
The pt called the office today requesting authorization to take gabapentin 300 mg.  He just received a refill on the 600 mg on 02/23/18.  Pt states he wants to ultimately stop taking gabapentin.  Would like to wean down.  Please advise.

## 2018-03-08 NOTE — Telephone Encounter (Signed)
I agree. He can cut the pills in half

## 2018-03-09 NOTE — Telephone Encounter (Signed)
lmomtcb x1 

## 2018-03-10 NOTE — Telephone Encounter (Signed)
Called and spoke with pt and he is aware of Dr. Claris Che recs. Nothing further is needed.

## 2018-03-12 ENCOUNTER — Ambulatory Visit: Payer: BLUE CROSS/BLUE SHIELD | Admitting: Family Medicine

## 2018-03-13 ENCOUNTER — Other Ambulatory Visit: Payer: Self-pay | Admitting: Family Medicine

## 2018-03-15 ENCOUNTER — Ambulatory Visit (INDEPENDENT_AMBULATORY_CARE_PROVIDER_SITE_OTHER): Payer: BLUE CROSS/BLUE SHIELD | Admitting: Family Medicine

## 2018-03-15 ENCOUNTER — Encounter: Payer: Self-pay | Admitting: Family Medicine

## 2018-03-15 VITALS — BP 120/68 | HR 85 | Temp 98.5°F | Wt 176.4 lb

## 2018-03-15 DIAGNOSIS — R232 Flushing: Secondary | ICD-10-CM

## 2018-03-15 DIAGNOSIS — E0590 Thyrotoxicosis, unspecified without thyrotoxic crisis or storm: Secondary | ICD-10-CM | POA: Diagnosis not present

## 2018-03-15 LAB — CBC WITH DIFFERENTIAL/PLATELET
Basophils Absolute: 0 10*3/uL (ref 0.0–0.1)
Basophils Relative: 0.8 % (ref 0.0–3.0)
EOS ABS: 0.2 10*3/uL (ref 0.0–0.7)
Eosinophils Relative: 4.6 % (ref 0.0–5.0)
HEMATOCRIT: 41.9 % (ref 39.0–52.0)
HEMOGLOBIN: 14.4 g/dL (ref 13.0–17.0)
Lymphocytes Relative: 42 % (ref 12.0–46.0)
Lymphs Abs: 2.1 10*3/uL (ref 0.7–4.0)
MCHC: 34.5 g/dL (ref 30.0–36.0)
MCV: 89.5 fl (ref 78.0–100.0)
Monocytes Absolute: 0.5 10*3/uL (ref 0.1–1.0)
Monocytes Relative: 9.2 % (ref 3.0–12.0)
Neutro Abs: 2.2 10*3/uL (ref 1.4–7.7)
Neutrophils Relative %: 43.4 % (ref 43.0–77.0)
Platelets: 305 10*3/uL (ref 150.0–400.0)
RBC: 4.68 Mil/uL (ref 4.22–5.81)
RDW: 12.8 % (ref 11.5–15.5)
WBC: 5.1 10*3/uL (ref 4.0–10.5)

## 2018-03-15 LAB — T4, FREE: FREE T4: 0.64 ng/dL (ref 0.60–1.60)

## 2018-03-15 LAB — HEPATIC FUNCTION PANEL
ALK PHOS: 73 U/L (ref 39–117)
ALT: 55 U/L — ABNORMAL HIGH (ref 0–53)
AST: 31 U/L (ref 0–37)
Albumin: 4.5 g/dL (ref 3.5–5.2)
BILIRUBIN TOTAL: 0.4 mg/dL (ref 0.2–1.2)
Bilirubin, Direct: 0.1 mg/dL (ref 0.0–0.3)
Total Protein: 7 g/dL (ref 6.0–8.3)

## 2018-03-15 LAB — BASIC METABOLIC PANEL
BUN: 18 mg/dL (ref 6–23)
CALCIUM: 9.3 mg/dL (ref 8.4–10.5)
CO2: 27 meq/L (ref 19–32)
Chloride: 104 mEq/L (ref 96–112)
Creatinine, Ser: 0.98 mg/dL (ref 0.40–1.50)
GFR: 91.44 mL/min (ref >60.00)
Glucose, Bld: 79 mg/dL (ref 70–99)
Potassium: 4.4 mEq/L (ref 3.5–5.1)
Sodium: 140 mEq/L (ref 135–145)

## 2018-03-15 LAB — T3, FREE: T3, Free: 4.4 pg/mL — ABNORMAL HIGH (ref 2.3–4.2)

## 2018-03-15 LAB — TESTOSTERONE: Testosterone: 158.42 ng/dL — ABNORMAL LOW (ref 300.00–890.00)

## 2018-03-15 LAB — TSH: TSH: 2.08 u[IU]/mL (ref 0.35–4.50)

## 2018-03-15 LAB — VITAMIN B12: VITAMIN B 12: 748 pg/mL (ref 211–911)

## 2018-03-15 MED ORDER — TEMAZEPAM 30 MG PO CAPS: 30.0000 mg | ORAL_CAPSULE | Freq: Every evening | ORAL | 5 refills | Status: DC | PRN

## 2018-03-15 NOTE — Progress Notes (Signed)
   Subjective:    Patient ID: Ryan Olsen, male    DOB: 1980/06/07, 37 y.o.   MRN: [id number]  HPI Here for hot flashes. These started about one year ago but they are getting worse. They getting more frequent and more intense. They happen at any time, day or night. He will suddenly feel extremely hot and will start to sweat profusely, especially on the head and trunk. He will feel lightheaded during these spells. They last from 10 to 30 minutes at a time. No redness or visible changes.    Review of Systems  Constitutional: Positive for diaphoresis. Negative for fever.  Respiratory: Negative.   Cardiovascular: Negative.   Gastrointestinal: Negative.   Endocrine: Positive for heat intolerance.  Genitourinary: Negative.   Neurological: Negative.        Objective:   Physical Exam  Constitutional: He is oriented to person, place, and time. He appears well-developed and well-nourished.  Neck: No thyromegaly present.  Cardiovascular: Normal rate, regular rhythm, normal heart sounds and intact distal pulses.  Pulmonary/Chest: Effort normal and breath sounds normal.  Musculoskeletal: He exhibits no edema.  Lymphadenopathy:    He has no cervical adenopathy.  Neurological: He is alert and oriented to person, place, and time.  Psychiatric: He has a normal mood and affect. His behavior is normal. Thought content normal.          Assessment & Plan:  Hot flashes. Check labs today. We will refer him to Endocrine as well, as he wishes.  Gershon Crane, MD

## 2018-03-18 NOTE — Addendum Note (Signed)
Addended by: Gershon Crane A on: 03/18/2018 09:55 AM   Modules accepted: Orders

## 2018-03-22 ENCOUNTER — Telehealth: Payer: Self-pay | Admitting: *Deleted

## 2018-03-22 NOTE — Telephone Encounter (Signed)
Copied from CRM [id number]. Topic: General - Other >> Mar 22, 2018 11:42 AM Ronney Lion A wrote: Reason for CRM: pt is concern abt his low levels of testosterone. He would like to know what he should do going forward; (follow up appt, medication, ect). Also he inquired on his referral to Endocrinology, advised pt the referral has been placed.  Please advise

## 2018-03-22 NOTE — Telephone Encounter (Signed)
Dr Fry please advise. thanks 

## 2018-03-24 NOTE — Telephone Encounter (Signed)
Reason for CRM:   Patient's caretaker and mother, Abed Schar, would like to know why Dr. Clent Ridges will not prescribe the medication her son needs since he knows now what the problem is because they have not heard from the Endocrinologist, and it has been a week and the patient is suffering.  Spoke with pt's father and advised per DPR we have no permission to speak with anyone other than pt. Father states he understands and will have pt call for update/clarification on results and recommendations.    Dr. Clent Ridges - FYI. Thanks!

## 2018-03-25 MED ORDER — TESTOSTERONE 20.25 MG/1.25GM (1.62%) TD GEL: 4.0000 | Freq: Every day | TRANSDERMAL | 2 refills | Status: DC

## 2018-03-25 NOTE — Telephone Encounter (Signed)
Per referral notes [facility] has LM for pt to call to schedule consultation appt. He has not called back yet.   Also LMTCB for pt to advise of this as well as Dr.Fry's message.

## 2018-03-25 NOTE — Telephone Encounter (Signed)
Pt calling back. Pt aware of messages below and states he has set up an appt with [facility] and will call his insurance as well.

## 2018-03-25 NOTE — Telephone Encounter (Signed)
I agree, we can go ahead and treat the low testosterone. Have him ask his insurance company what forms of treatment they will cover (gels, shots, etc)

## 2018-03-25 NOTE — Telephone Encounter (Signed)
Rx called in per Dr Clent Ridges.

## 2018-03-25 NOTE — Telephone Encounter (Signed)
Pt calling back stating that he  Has made appt with an endocrinologist office and that he called his insurance and that they will accept the gel as long as it is coded correctly with low testosterone and that it can be generic if any questions call pt at [phone] [id number] - [place], [place] - [address] [facility] [address] [facility] [address]

## 2018-03-29 NOTE — Progress Notes (Signed)
Name: Ryan Olsen  MRN/ DOB: [id number], [birth year]    Age/ Sex: 37 y.o., male    PCP: Ryan Salisbury, MD   Reason for Endocrinology Evaluation: Abnormal T3      Date of Initial Endocrinology Evaluation: 03/30/2018     HPI: Mr. Ryan Olsen is a 37 y.o. male with a past medical history of GERD, neuroapthy, BPH and insomnia . The patient presented for initial endocrinology clinic visit on 03/30/2018 for consultative assistance with his abnormal T3 .   Patient with c/o hot flashes, excessive sweating, associated with anxiety attacks. He has battled depression since the age of 26. He used to be a heroin addict but has been on suboxone for ~10 yrs.  During the episodes, his face becomes flushed and hot , these symptoms were noted few months ago. The episodes are intermittent, no triggering factors.   Pt has received 6 ketamine infusion treatments , the last session was 3 weeks prior to his TFT checks.  Pt claims he was told he has  thyroid dz since age 89, but never been prescribed any meds ? Marland Kitchen Pt tells me he has been diagnosed with manic depressive , OCD and depression/anxiety.   Denies any local neck symptoms  No Fh of endocrine disorder but believes his mother may have thyroid disease despite not being on any meds.   Pt was diagnosed with low testosterone and PCP prescribed topical testosterone. Pt was unable to get this filled by pharmacy due to prior authorization requirements, so for the past week he started using his father's one pump a day except today, he started using 4 pumps a day.   Denies prior use of corticosteroids, anabolic steroids.  Denies recent illicit drug use or marijuana use.   Father with prostate cancer, diagnosed 2 yrs ago.  He feels tired with no energy but denies decreased libido or difficulty with erections.    HISTORY:  Past Medical History:  Past Medical History:  Diagnosis Date  . Depression   . GERD (gastroesophageal reflux disease)   .  Hiatal hernia     Past Surgical History: No past surgical history on file.   Social History:  reports that he has quit smoking. He started smoking about 10 months ago. He has never used smokeless tobacco. He reports that he has current or past drug history. Drug: Marijuana. He reports that he does not drink alcohol.  Family History: family history includes Cancer - Prostate in his father and paternal grandfather; Depression in his father; Prostate cancer in his father and paternal grandfather.   HOME MEDICATIONS: Current Outpatient Medications on File Prior to Visit  Medication Sig Dispense Refill  . buPROPion (WELLBUTRIN XL) 150 MG 24 hr tablet Take 1 tablet (150 mg total) by mouth daily. 30 tablet 2  . buPROPion (WELLBUTRIN XL) 300 MG 24 hr tablet TAKE 1 TABLET BY MOUTH EVERY DAY 30 tablet 0  . clonazePAM (KLONOPIN) 2 MG tablet Take 1 tablet (2 mg total) by mouth at bedtime. 30 tablet 5  . Cyanocobalamin (VITAMIN B 12 PO) Take daily by mouth.    . gabapentin (NEURONTIN) 600 MG tablet Take 1 tablet (600 mg total) by mouth 3 (three) times daily. 90 tablet 5  . Magnesium 400 MG CAPS Take daily by mouth.    Marland Kitchen omeprazole (PRILOSEC) 20 MG capsule Take 1 capsule (20 mg total) by mouth daily. 30 capsule 5  . temazepam (RESTORIL) 30 MG capsule Take 1 capsule (30  mg total) by mouth at bedtime as needed for sleep. 30 capsule 5  . Testosterone (ANDROGEL) 20.25 MG/1.25GM (1.62%) GEL Place 4 Pump onto the skin daily. 1.25 g 2  . silver sulfADIAZINE (SILVADENE) 1 % cream Apply 1 application topically 2 (two) times daily. (Patient not taking: Reported on 03/30/2018) 50 g 0  . tamsulosin (FLOMAX) 0.4 MG CAPS capsule TAKE 1 CAPSULE(0.4 MG) BY MOUTH DAILY (Patient not taking: Reported on 03/30/2018) 90 capsule 3   No current facility-administered medications on file prior to visit.       REVIEW OF SYSTEMS: A comprehensive ROS was conducted with the patient and is negative except as per HPI and below:    ROS     OBJECTIVE:  VS: BP (!) 148/98 (BP Location: Right Arm, Patient Position: Sitting, Cuff Size: Normal)   Pulse (!) 112   Ht 5\' 7"  (1.702 m)   Wt 178 lb 9.6 oz (81 kg)   SpO2 96%   BMI 27.97 kg/m    Wt Readings from Last 3 Encounters:  03/30/18 178 lb 9.6 oz (81 kg)  03/15/18 176 lb 6.4 oz (80 kg)  12/29/17 170 lb (77.1 kg)     EXAM: General: Pt appears well and is in NAD  Hydration: Well-hydrated with moist mucous membranes and good skin turgor  Eyes: External eye exam normal without stare, lid lag or exophthalmos.  EOM intact.  PERRL.  Ears, Nose, Throat: Hearing: Grossly intact bilaterally Throat: Clear without mass, erythema or exudate  Neck: General: Supple without adenopathy. Thyroid: Thyroid size normal.  No goiter or nodules appreciated. No thyroid bruit.  Lungs: Clear with good BS bilat with no rales, rhonchi, or wheezes  Heart: Auscultation: RRR.  Abdomen: Normoactive bowel sounds, soft, nontender, without masses or organomegaly palpable  Extremities: BL LE: No pretibial edema normal ROM and strength.  Skin: Hair: Texture and amount normal with gender appropriate distribution Skin Inspection: No rashes, acanthosis nigricans/skin tags.  Skin Palpation: Skin temperature, texture, and thickness normal to palpation  Neuro: Cranial nerves: II - XII grossly intact  Motor: Normal strength throughout DTRs: 2+ and symmetric in UE without delay in relaxation phase  Mental Status: Judgment, insight: Intact Orientation: Oriented to time, place, and person Mood and affect: No depression, anxiety, or agitation     DATA REVIEWED:  Results for Ryan, Olsen (MRN [id number]) as of 03/29/2018 14:54  Ref. Range 03/15/2018 13:56  TSH Latest Ref Range: 0.35 - 4.50 uIU/mL 2.08  Triiodothyronine,Free,Serum Latest Ref Range: 2.3 - 4.2 pg/mL 4.4 (H)  T4,Free(Direct) Latest Ref Range: 0.60 - 1.60 ng/dL 2.95  Results for Ryan, Olsen (MRN [id number]) as of 03/30/2018  14:44  Ref. Range 03/15/2018 13:56  Testosterone Latest Ref Range: 300.00 - 890.00 ng/dL 102.72 (L)       Results for Ryan, Olsen (MRN [id number]) as of 03/31/2018 12:45  Ref. Range 03/30/2018 14:45  TSH Latest Ref Range: 0.35 - 4.50 uIU/mL 1.02  Triiodothyronine,Free,Serum Latest Ref Range: 2.3 - 4.2 pg/mL 4.2  T4,Free(Direct) Latest Ref Range: 0.60 - 1.60 ng/dL 7.42    ASSESSMENT/PLAN/RECOMMENDATIONS:   1. Abnormal T3:   - Pt is clinically and biochemically euthyroid - Not sure why his T3 was elevated in the past could have been a lab error vs interference with other meds as he did have ketamine infusion 3 weeks prior to the test .  - No further work up needed.    2. Low Testosterone  :  - Pt requesting further  evaluation of low testosterone despite referral being for abnormal thyroid testing.  - Pt c/o non specific symptoms of fatigue, but denies any libido dysfunction or erectile dysfunction  - His testosterone was low but this was during afternoon and not in fasting status, also difficult to interpret without LH.  - I have explained to him that with starting his father's topical testosterone a week ago , this may affect the results of serum testosterone, LH/FSH - Pt is willing to stop testosterone at this time and recheck labs in the fasting status and in the morning between 7:30-8 AM.  - We discussed side effects of testosterone therapy such as erythropoiesis, worsening of sleep apnea that is severe and untreated,  Prostate volumes and serum PSA increase in response to testosterone treatment which might increase BPH and worsen urinary outflow obstruction as well as prostate cancer risk.  - We also discussed there is a possibility of increased cardiovascular risk associated with testosterone use.     F/u in 2 months   Signed electronically by: Lyndle Herrlich, MD  [facility]  [facility] [address] [place] [place], [place]  [address] Phone: [phone] FAX: [phone]   CC: Ryan Salisbury, MD [address] [place] [address] Phone: [phone] Fax: [phone]   Return to Endocrinology clinic as below: Future Appointments  Date Time Provider Department Center  06/01/2018 11:10 AM Shamleffer, Konrad Dolores, MD [facility] None

## 2018-03-30 ENCOUNTER — Encounter: Payer: Self-pay | Admitting: Internal Medicine

## 2018-03-30 ENCOUNTER — Ambulatory Visit (INDEPENDENT_AMBULATORY_CARE_PROVIDER_SITE_OTHER): Payer: BLUE CROSS/BLUE SHIELD | Admitting: Internal Medicine

## 2018-03-30 VITALS — BP 148/98 | HR 112 | Ht 67.0 in | Wt 178.6 lb

## 2018-03-30 DIAGNOSIS — R7989 Other specified abnormal findings of blood chemistry: Secondary | ICD-10-CM

## 2018-03-30 DIAGNOSIS — R5383 Other fatigue: Secondary | ICD-10-CM | POA: Diagnosis not present

## 2018-03-30 LAB — T3, FREE: T3, Free: 4.2 pg/mL (ref 2.3–4.2)

## 2018-03-30 LAB — TSH: TSH: 1.02 u[IU]/mL (ref 0.35–4.50)

## 2018-03-30 LAB — T4, FREE: Free T4: 0.71 ng/dL (ref 0.60–1.60)

## 2018-03-30 NOTE — Patient Instructions (Signed)
-   Please stop by the lab today for thyroid testing - Please stop the testosterone and stop by the lab next week for testosterone check. Please present in the morning between 7:30-8 am and make sure you are fasting

## 2018-03-31 ENCOUNTER — Encounter: Payer: Self-pay | Admitting: Internal Medicine

## 2018-03-31 ENCOUNTER — Other Ambulatory Visit: Payer: Self-pay | Admitting: Family Medicine

## 2018-04-02 ENCOUNTER — Ambulatory Visit: Payer: Self-pay

## 2018-04-02 DIAGNOSIS — F331 Major depressive disorder, recurrent, moderate: Secondary | ICD-10-CM | POA: Diagnosis not present

## 2018-04-02 DIAGNOSIS — F900 Attention-deficit hyperactivity disorder, predominantly inattentive type: Secondary | ICD-10-CM | POA: Diagnosis not present

## 2018-04-02 NOTE — Telephone Encounter (Signed)
Pt has questions because he is having problems getting his Testosterone (ANDROGEL) 20.25 MG/1.25GM (1.62%) GEL. States that pharmacy is telling him it requires prior authorization.

## 2018-04-06 NOTE — Telephone Encounter (Signed)
Patient calling and would like to know what is taking so long to get this medication approved through his insurance. States that on 04/02/18 his insurance told him that he needed a PA. States that nothing has ben sent or approved yet. Would like to know what is taking so long. States this process was supposed to be started on 03/18/18 when he spoke with someone and it wasn't. Needs a call with an update asap. Patient very unhappy.

## 2018-04-07 MED ORDER — BUPROPION HCL ER (XL) 300 MG PO TB24: 300.0000 mg | ORAL_TABLET | Freq: Every day | ORAL | 2 refills | Status: DC

## 2018-04-07 NOTE — Telephone Encounter (Signed)
Called [phone] to initiate the PA for the testosterone gel.  They only fax the forms and will not do the PA over the phone.  Waiting on these forms now.

## 2018-04-07 NOTE — Addendum Note (Signed)
Addended by: Marcellus ScottADKINS, Loucile Posner L on: 04/07/2018 09:54 AM   Modules accepted: Orders

## 2018-04-12 ENCOUNTER — Telehealth: Payer: Self-pay | Admitting: Family Medicine

## 2018-04-12 NOTE — Telephone Encounter (Signed)
Noted  

## 2018-04-12 NOTE — Telephone Encounter (Signed)
Pharmacy called will refax paperwork for Prior Authorization for Testosterone (ANDROGEL) 20.25 MG/1.25GM (1.62%) GEL

## 2018-04-12 NOTE — Telephone Encounter (Signed)
[facility] when she called this morning.  Informed her that Ryan Olsen has not received the paper work.  Advised that he call his pharmacy provider and have then refax paper work.  [employer] agent to pass on this information.  Nothing further needed at this time.

## 2018-04-12 NOTE — Telephone Encounter (Signed)
Copied from CRM [id number]. Topic: Quick Communication - See Telephone Encounter >> Apr 12, 2018 10:14 AM Floria RavelingStovall, Shana A wrote: CRM for notification. See Telephone encounter for: 04/12/18.  [employer] called in about the PA on the Testosterone (ANDROGEL) 20.25 MG/1.25GM (1.62%) GEL [[id number]- they stated that the info was faxed over on 11/20. They stated they have not rec anything back and pt is asking about the status   Also contact the pt with status - [phone] Best number - [phone] Fax number [phone] She stated it needs to have URGENT put on it

## 2018-04-13 NOTE — Telephone Encounter (Signed)
Forms received and given to Dr. Clent RidgesFry to fill out for the PA for the androgel.  [employer] requires that 2 separate blood tests for the testosterone level be checked and sent in with the PA.  Pt is aware that he will need another blood test done.  He stated that he has been using his fathers extra testosterone gel and that he has been using this for 3 weeks.  Advised to stop using that gel for at least 2 weeks and come back for repeat testosterone level.

## 2018-04-13 NOTE — Telephone Encounter (Signed)
Called patient to let him know we are waiting on forms for PA. Will check with Leigh to see if this has been recived.

## 2018-04-13 NOTE — Telephone Encounter (Signed)
Pt and his father called back this morning about the PA for the testosterone.  They were very upset that this has not been handled.  The pts father stated that they spoke with [employer] and our office yesterday and the blame keeps getting shifted back and forth.  I advised that we were very sorry about this and not trying to shift the blame to anyone, but wanted to make sure that the PA was done so he could get his medication.  I have called the [employer] of [place] again and waiting on the fax to be sent again.

## 2018-04-14 NOTE — Telephone Encounter (Signed)
Patient has been informed that his insurance is requiring to have another testosterone level check.  Patient informed by Wardell Honouronnie Adkins

## 2018-04-25 ENCOUNTER — Other Ambulatory Visit: Payer: Self-pay | Admitting: Family Medicine

## 2018-04-29 ENCOUNTER — Other Ambulatory Visit (INDEPENDENT_AMBULATORY_CARE_PROVIDER_SITE_OTHER): Payer: BLUE CROSS/BLUE SHIELD

## 2018-04-29 ENCOUNTER — Other Ambulatory Visit: Payer: Self-pay | Admitting: Family Medicine

## 2018-04-29 DIAGNOSIS — R7989 Other specified abnormal findings of blood chemistry: Secondary | ICD-10-CM

## 2018-05-01 LAB — TESTOSTERONE TOTAL,FREE,BIO, MALES
Albumin: 4.9 g/dL (ref 3.6–5.1)
Sex Hormone Binding: 23 nmol/L (ref 10–50)
TESTOSTERONE: 228 ng/dL — AB (ref 250–827)

## 2018-05-06 ENCOUNTER — Telehealth: Payer: Self-pay

## 2018-05-06 NOTE — Telephone Encounter (Signed)
Copied from CRM [id number]. Topic: General - Other >> May 06, 2018 11:14 AM Gean BirchwoodWilliams-Neal, Sade R wrote: Patient is calling in for his results from 04/29/2018. Patient states this holding him up from getting his script. >> May 06, 2018  4:51 PM Arlyss Gandyichardson, Taren N, NT wrote: Pt checking status on hearing back from his results.

## 2018-05-07 ENCOUNTER — Telehealth: Payer: Self-pay | Admitting: *Deleted

## 2018-05-07 NOTE — Telephone Encounter (Signed)
Copied from CRM [id number]. Topic: Quick Communication - See Telephone Encounter >> May 07, 2018 10:55 AM Mare LoanBurton, Donna F wrote: Pt is needing to get his testosterone results from 04/29/18 he has been waiting a while -he states that this determines what rx her gets at the pharmacy and if he has to have labs done again  Best number [phone]

## 2018-05-07 NOTE — Telephone Encounter (Signed)
Dr. Fry please advise. Thanks  

## 2018-05-07 NOTE — Telephone Encounter (Signed)
The second level came back low as well. Now we can send this in to the insurance company to have the rx approved

## 2018-05-07 NOTE — Telephone Encounter (Signed)
Copy of lab results faxed and confirmed 

## 2018-05-10 NOTE — Telephone Encounter (Signed)
Please tell him the results they were both low.

## 2018-05-10 NOTE — Telephone Encounter (Signed)
Pt called again to find out the results of his lab work from 04/29/18. He knows results have been given to insurance but he has not been given them himself. Please advise. CB# [phone]

## 2018-05-10 NOTE — Telephone Encounter (Signed)
Patient is aware of lab results.

## 2018-05-14 ENCOUNTER — Telehealth: Payer: Self-pay

## 2018-05-14 NOTE — Telephone Encounter (Signed)
[facility] [employer] [facility], forms have been completed and faxed back.

## 2018-05-20 ENCOUNTER — Telehealth: Payer: Self-pay

## 2018-05-20 NOTE — Telephone Encounter (Signed)
Dr. Clent RidgesFry please advise on pt picking up last 2 testosterone test results.   Thanks

## 2018-05-20 NOTE — Telephone Encounter (Signed)
Printed and ready to pick up.

## 2018-05-20 NOTE — Telephone Encounter (Signed)
Copied from CRM [id number]. Topic: General - Other >> May 20, 2018 11:15 AM Jaquita Rector A wrote: Reason for CRM: Patient called to request a copy of his last 2 Testosterone results taken. He would like to pick that up today.  Please advise  Patient call back # [phone]

## 2018-05-20 NOTE — Telephone Encounter (Signed)
Called and spoke with pt and he is aware of papers that are ready to be picked up.

## 2018-05-21 DIAGNOSIS — E291 Testicular hypofunction: Secondary | ICD-10-CM | POA: Diagnosis not present

## 2018-05-23 ENCOUNTER — Other Ambulatory Visit: Payer: Self-pay | Admitting: Family Medicine

## 2018-05-27 ENCOUNTER — Other Ambulatory Visit: Payer: Self-pay | Admitting: Family Medicine

## 2018-05-28 NOTE — Telephone Encounter (Signed)
Received a fax from [facility] stating Testosterone has been approved.

## 2018-05-28 NOTE — Telephone Encounter (Signed)
Dr. Clent Ridges please advise on refill of medications.  thanks

## 2018-05-31 ENCOUNTER — Other Ambulatory Visit: Payer: Self-pay | Admitting: Family Medicine

## 2018-05-31 DIAGNOSIS — F332 Major depressive disorder, recurrent severe without psychotic features: Secondary | ICD-10-CM | POA: Diagnosis not present

## 2018-05-31 NOTE — Telephone Encounter (Signed)
Call in Clonazepam #30 with 5 rf. NO refills on Zolpidem because he got a 6 month supply of Temazepam in October

## 2018-06-01 ENCOUNTER — Telehealth: Payer: Self-pay | Admitting: Family Medicine

## 2018-06-01 ENCOUNTER — Ambulatory Visit: Payer: BLUE CROSS/BLUE SHIELD | Admitting: Internal Medicine

## 2018-06-01 MED ORDER — CLONAZEPAM 2 MG PO TABS: 2.0000 mg | ORAL_TABLET | Freq: Every day | ORAL | 5 refills | Status: DC

## 2018-06-01 MED ORDER — TEMAZEPAM 30 MG PO CAPS: 30.0000 mg | ORAL_CAPSULE | Freq: Every evening | ORAL | 2 refills | Status: DC | PRN

## 2018-06-01 NOTE — Telephone Encounter (Signed)
Medications have been called to the pharmacy at [facility] on [address] and this has been left on the VM for refills. Pt is aware.

## 2018-06-01 NOTE — Telephone Encounter (Signed)
Called the pharmacy---  [facility]:  Clonazepam  Last filled 01/11/2018 no refills Temazepam last filled on 04/30/2018  #30 with 2 refills left.   ambien last filled on 01/02/2018 for #30 but this was closed out--no further refills.     [facility]  ambien   05/02/2018  #30  With no refills Clonazepam 05/02/2018 for #30 with no refills Never filled temazepam at this pharmacy.    Please advise on refills you want to give. Pt has called and is anxious for his refills.  Thanks

## 2018-06-01 NOTE — Telephone Encounter (Signed)
Duplicate request for the same refills.  See other refill from 05/27/2018

## 2018-06-01 NOTE — Telephone Encounter (Signed)
Rx is pended at office for provider review 

## 2018-06-01 NOTE — Telephone Encounter (Signed)
Copied from CRM [id number]. Topic: Quick Communication - Rx Refill/Question >> Jun 01, 2018  4:12 PM Ryan Olsen wrote:  Medication: clonazePAM Ryan Olsen) 2 MG tablet  Has the patient contacted their pharmacy? Yes.   (Agent: If no, request that the patient contact the pharmacy for the refill.) (Agent: If yes, when and what did the pharmacy advise?)  Preferred Pharmacy (with phone number or street name): [id number] [place], [place] - [address] [phone] (Phone) [phone] (Fax)    Agent: Please be advised that RX refills may take up to 3 business days. We ask that you follow-up with your pharmacy.

## 2018-06-01 NOTE — Telephone Encounter (Signed)
Patient calling and states that he was told to check with the pharmacy. States that he did that and they do not have the Clonazepam. Patient inquiring about the zolpidem, advised that per Dr Claris CheFry's message below, he received a 6 month supply of Temazepam in October. Told him to check the pharmacy for that.   Would like to know if the Clonazepam could be sent? Okay already given by Dr Clent RidgesFry below.

## 2018-06-01 NOTE — Telephone Encounter (Signed)
Pt called in to check the status of his refill request for Clonazepam and also Zolpidem.   Please advise if Rx's has been sent in?   CB: [phone]

## 2018-06-02 ENCOUNTER — Other Ambulatory Visit: Payer: Self-pay | Admitting: Family Medicine

## 2018-06-08 DIAGNOSIS — F332 Major depressive disorder, recurrent severe without psychotic features: Secondary | ICD-10-CM | POA: Diagnosis not present

## 2018-06-14 ENCOUNTER — Other Ambulatory Visit (HOSPITAL_COMMUNITY)
Admission: RE | Admit: 2018-06-14 | Discharge: 2018-06-14 | Disposition: A | Discharge status: Home / Self Care | Payer: BLUE CROSS/BLUE SHIELD | Source: Ambulatory Visit | Attending: Internal Medicine | Admitting: Internal Medicine

## 2018-06-14 ENCOUNTER — Ambulatory Visit: Payer: BLUE CROSS/BLUE SHIELD | Admitting: Internal Medicine

## 2018-06-14 VITALS — BP 116/78 | HR 76 | Temp 98.4°F | Wt 176.2 lb

## 2018-06-14 DIAGNOSIS — N50812 Left testicular pain: Secondary | ICD-10-CM | POA: Insufficient documentation

## 2018-06-14 LAB — POC URINALSYSI DIPSTICK (AUTOMATED)
Bilirubin, UA: NEGATIVE
Glucose, UA: NEGATIVE
Ketones, UA: NEGATIVE
Leukocytes, UA: NEGATIVE
Nitrite, UA: NEGATIVE
PH UA: 6 (ref 5.0–8.0)
Protein, UA: NEGATIVE
RBC UA: NEGATIVE
Spec Grav, UA: 1.025 (ref 1.010–1.025)
Urobilinogen, UA: 0.2 E.U./dL

## 2018-06-14 MED ORDER — DOXYCYCLINE HYCLATE 100 MG PO TABS: 100.0000 mg | ORAL_TABLET | Freq: Two times a day (BID) | ORAL | 0 refills | Status: DC

## 2018-06-14 NOTE — Patient Instructions (Signed)
I agree inflammation noted   Begin  Antibiotic after getting urine tests  Fu with dr Clent Ridges or urology in  5-7 days  Earlier if pain worse seek care in ED>

## 2018-06-14 NOTE — Progress Notes (Signed)
Chief Complaint  Patient presents with  . Groin Pain    sx started last night left testicle, no pain with urination, no intercourse within over 1 year    HPI: Ryan Olsen 38 y.o. come in for sda   PCP appt NA   Hx of  Similar  Pain   10 years ago   No trauma fever dysuria but concern about epididimitis   Sore and tender  Area  With walking   No abd pain fever vomiting  Recently given clomid for low testosterone  [place]f in [place]  No recent  contactx     soreness and tenderness noted Onset last night about 3 am  ROS: See pertinent positives and negatives per HPI. No fever chills    Past Medical History:  Diagnosis Date  . Depression   . GERD (gastroesophageal reflux disease)   . Hiatal hernia     Family History  Problem Relation Age of Onset  . Prostate cancer Father   . Depression Father   . Cancer - Prostate Father   . Prostate cancer Paternal Grandfather   . Cancer - Prostate Paternal Grandfather     Social History   Socioeconomic History  . Marital status: Single    Spouse name: Not on file  . Number of children: Not on file  . Years of education: Not on file  . Highest education level: Not on file  Occupational History  . Not on file  Social Needs  . Financial resource strain: Not on file  . Food insecurity:    Worry: Not on file    Inability: Not on file  . Transportation needs:    Medical: Not on file    Non-medical: Not on file  Tobacco Use  . Smoking status: Former Smoker    Start date: 05/16/2017  . Smokeless tobacco: Never Used  Substance and Sexual Activity  . Alcohol use: No  . Drug use: Not Currently    Types: Marijuana  . Sexual activity: Not on file  Lifestyle  . Physical activity:    Days per week: Not on file    Minutes per session: Not on file  . Stress: Not on file  Relationships  . Social connections:    Talks on phone: Not on file    Gets together: Not on file    Attends religious service: Not on file    Active member of club  or organization: Not on file    Attends meetings of clubs or organizations: Not on file    Relationship status: Not on file  Other Topics Concern  . Not on file  Social History Narrative  . Not on file    Outpatient Medications Prior to Visit  Medication Sig Dispense Refill  . buPROPion (WELLBUTRIN XL) 150 MG 24 hr tablet TAKE 1 TABLET BY MOUTH EVERY DAY 30 tablet 2  . buPROPion (WELLBUTRIN XL) 300 MG 24 hr tablet Take 1 tablet (300 mg total) by mouth daily. 30 tablet 2  . clomiPHENE (CLOMID) 50 MG tablet TK 1 T PO QD    . clonazePAM (KLONOPIN) 2 MG tablet Take 1 tablet (2 mg total) by mouth at bedtime. 30 tablet 5  . Cyanocobalamin (VITAMIN B 12 PO) Take daily by mouth.    . gabapentin (NEURONTIN) 300 MG capsule TAKE 1 CAPSULE BY MOUTH THREE TIMES A DAY 90 capsule 5  . gabapentin (NEURONTIN) 600 MG tablet Take 1 tablet (600 mg total) by mouth 3 (three)  times daily. 90 tablet 5  . Magnesium 400 MG CAPS Take daily by mouth.    Marland Kitchen. omeprazole (PRILOSEC) 20 MG capsule TAKE 1 CAPSULE BY MOUTH EVERY DAY 90 capsule 1  . temazepam (RESTORIL) 30 MG capsule Take 1 capsule (30 mg total) by mouth at bedtime as needed for sleep. 30 capsule 2  . Testosterone (ANDROGEL) 20.25 MG/1.25GM (1.62%) GEL Place 4 Pump onto the skin daily. 1.25 g 2  . silver sulfADIAZINE (SILVADENE) 1 % cream Apply 1 application topically 2 (two) times daily. (Patient not taking: Reported on 03/30/2018) 50 g 0  . tamsulosin (FLOMAX) 0.4 MG CAPS capsule TAKE 1 CAPSULE(0.4 MG) BY MOUTH DAILY (Patient not taking: Reported on 03/30/2018) 90 capsule 3   No facility-administered medications prior to visit.      EXAM:  BP 116/78 (BP Location: Left Arm, Patient Position: Sitting, Cuff Size: Normal)   Pulse 76   Temp 98.4 F (36.9 C) (Oral)   Wt 176 lb 3.2 oz (79.9 kg)   SpO2 96%   BMI 27.60 kg/m   Body mass index is 27.6 kg/m.  GENERAL: vitals reviewed and listed above, alert, oriented, appears well hydrated and in no  acute distress HEENT: atraumatic, conjunctiva  clear, no obvious abnormalities on inspection of external nose and ears   abd soft no  tedneresns   GU  left testicle t tender  ? Mild pink area   No adenopathy  Slight edema  No abd pain left higher than left  Sensitive to touch  Right no pain on exam  MS: moves all extremities without noticeable focal  abnormality PSYCH: pleasant and cooperative,   BP Readings from Last 3 Encounters:  06/14/18 116/78  03/30/18 (!) 148/98  03/15/18 120/68    ASSESSMENT AND PLAN:  Discussed the following assessment and plan:  Testicular pain, left - prob epididymitis  based on hx  of same  no assoc sx  - Plan: POCT Urinalysis Dipstick (Automated), Urine cytology ancillary only On med for low testosterone  Chronic  via urology    Expectant management. Empiric  rx and fu pcp or urology -Patient advised to return or notify health care team  if  new concerns arise.  Patient Instructions  I agree inflammation noted   Begin  Antibiotic after getting urine tests  Fu with dr Clent RidgesFry or urology in  5-7 days  Earlier if pain worse seek care in ED>    [facility] Center[facility]anda K. Naheim Burgen M.D.

## 2018-06-15 DIAGNOSIS — F332 Major depressive disorder, recurrent severe without psychotic features: Secondary | ICD-10-CM | POA: Diagnosis not present

## 2018-06-16 LAB — URINE CYTOLOGY ANCILLARY ONLY
CHLAMYDIA, DNA PROBE: NEGATIVE
Neisseria Gonorrhea: NEGATIVE

## 2018-06-18 ENCOUNTER — Other Ambulatory Visit: Payer: Self-pay | Admitting: Family Medicine

## 2018-06-21 ENCOUNTER — Telehealth: Payer: Self-pay | Admitting: Family Medicine

## 2018-06-21 DIAGNOSIS — F332 Major depressive disorder, recurrent severe without psychotic features: Secondary | ICD-10-CM | POA: Diagnosis not present

## 2018-06-21 NOTE — Telephone Encounter (Signed)
Pt given lab results per notes of Dr Fabian Sharp on 06/14/2018. Pt verbalized understanding.

## 2018-06-21 NOTE — Telephone Encounter (Signed)
Dr. Fabian Sharp the pt is requesting a work note for the visit he had with you on 06/14/2018.  Please advise. Thanks

## 2018-06-22 ENCOUNTER — Telehealth: Payer: Self-pay | Admitting: Family Medicine

## 2018-06-22 NOTE — Telephone Encounter (Signed)
Father of the pt came by here to pick up a work note that was told that would be up front and he is going to be coming back to pick it up after lunch pt needs it to go back to work tonight.  Father was very upset that things is not going the way that it should with Rx's and now the note for the pt to return back to work (meaning no one is giving them a call stating that it was called in or it is ready).

## 2018-06-22 NOTE — Telephone Encounter (Signed)
Asher Muir spoke with Dr. Fabian Sharp and she was ok with doing the work note for the pt.  This has been completed and Jamie left this up front for her pts father to come back to pick up.

## 2018-06-22 NOTE — Telephone Encounter (Signed)
Dr. Fabian Sharp the pt was seen  1/27 by you and is requesting a work note.  His father would like to pick this up after lunch today/  Please advise. Thanks

## 2018-06-23 ENCOUNTER — Telehealth: Payer: Self-pay | Admitting: Family Medicine

## 2018-06-23 NOTE — Telephone Encounter (Signed)
Copied from CRM [id number]. Topic: General - Other >> Jun 23, 2018  1:54 PM Leafy Ro wrote: Reason for CRM: pt is calling and the temazepam is no longer working after 2 nights  the pt is unable to sleep. Pt would like to go back to ambien 10 mg. [facility]

## 2018-06-23 NOTE — Telephone Encounter (Signed)
Dr. Fry please advise. Thanks  

## 2018-06-24 NOTE — Telephone Encounter (Signed)
Cancel Temazepam and call in Zolpidem 10 mg qhs #30 with 5 rf 

## 2018-07-01 MED ORDER — ZOLPIDEM TARTRATE 10 MG PO TABS: 10.0000 mg | ORAL_TABLET | Freq: Every evening | ORAL | 5 refills | Status: DC | PRN

## 2018-07-01 NOTE — Telephone Encounter (Signed)
I have called the pharmacy and given the new rx zolpidem 10 mg  #30  1 daily.  I cancelled out all remaining refills of the temezepam.

## 2018-07-01 NOTE — Addendum Note (Signed)
Addended by: Marcellus Scott on: 07/01/2018 09:47 AM   Modules accepted: Orders

## 2018-07-02 ENCOUNTER — Other Ambulatory Visit: Payer: Self-pay | Admitting: Family Medicine

## 2018-07-05 NOTE — Telephone Encounter (Signed)
Dr. Clent Ridges please advise if ok for a 90 day supply.  thanks

## 2018-07-07 DIAGNOSIS — F332 Major depressive disorder, recurrent severe without psychotic features: Secondary | ICD-10-CM | POA: Diagnosis not present

## 2018-07-07 DIAGNOSIS — F900 Attention-deficit hyperactivity disorder, predominantly inattentive type: Secondary | ICD-10-CM | POA: Diagnosis not present

## 2018-07-07 DIAGNOSIS — F410 Panic disorder [episodic paroxysmal anxiety] without agoraphobia: Secondary | ICD-10-CM | POA: Diagnosis not present

## 2018-07-15 ENCOUNTER — Telehealth: Payer: Self-pay | Admitting: *Deleted

## 2018-07-15 NOTE — Telephone Encounter (Signed)
Copied from CRM [id number]. Topic: General - Other >> Jul 15, 2018  4:13 PM Trula Slade wrote: Reason for CRM:   Patient would like to know why he was taken off of temazepam (RESTORIL) 30 MG capsule and put on Ambien.  He stated the Ambien is not working.  He only gets one hour of sleep at night with the Ambien.  Then he's groggy.

## 2018-07-16 MED ORDER — TEMAZEPAM 30 MG PO CAPS: 30.0000 mg | ORAL_CAPSULE | Freq: Every evening | ORAL | 5 refills | Status: DC | PRN

## 2018-07-16 NOTE — Telephone Encounter (Signed)
We cancelled the temazepam on 2/5 and called in Ambien for the pt.  See the crm note below from that call:  Copied from CRM [id number]. Topic: General - Other >> Jun 23, 2018  1:54 PM Leafy Ro wrote: Reason for CRM: pt is calling and the temazepam is no longer working after 2 nights  the pt is unable to sleep. Pt would like to go back to ambien 10 mg. [facility]   Dr. Clent Ridges please advise. Thanks

## 2018-07-16 NOTE — Telephone Encounter (Signed)
lmomtcb x1 

## 2018-07-16 NOTE — Telephone Encounter (Signed)
As you noted we simply did what he asked [place] to do. I don't care which one he is on, but he needs to make up his mind

## 2018-07-16 NOTE — Telephone Encounter (Signed)
I called and spoke with the pt.  He stated that he did not call to have the medications changed from temazepam to the [place].  He stated the following:  He stated that his mother wants to be his doctor and she called and got those meds changed for him.  I advised him that we are not allowed to talk to anyone if they are not listed on his DPR.  He stated that he has never given his mother permission to speak to anyone about his medical care.  I was the one that spoke with the pt for the call he is referring to earlier this month.    Per Dr. Alison Stalling to cancel the [place], which has been done with the pharmacy and I have called the temazepam to the pharmacy and cancelled the ambien refills and rx.  Spoke with Mindy at the pharmacy.

## 2018-08-05 ENCOUNTER — Telehealth: Payer: Self-pay | Admitting: Family Medicine

## 2018-08-05 NOTE — Telephone Encounter (Signed)
Copied from CRM [id number]. Topic: Quick Communication - See Telephone Encounter >> Aug 05, 2018 12:42 PM Trula Slade wrote: CRM for notification. See Telephone encounter for: 08/05/18. Patient's Testosterone (ANDROGEL) 20.25 MG/1.25GM (1.62%) GEL medication was prescribed for him back in 11/19 but he couldn't afford it and didn't pick it up.  He would now  like another script sent to [facility] [address]

## 2018-08-05 NOTE — Telephone Encounter (Signed)
Patient called and stated that after talking with his insurance company, they said that the script for his medication Testosterone (ANDROGEL) 20.25 MG/1.25GM (1.62%) GEL has to be sent in as a lesser qualtity, 1 bottle per month, for it to be covered by his insurance.  The patient would like for that to be sent in as soon as possible.  Please advise and call back at [phone]

## 2018-08-06 ENCOUNTER — Encounter: Payer: Self-pay | Admitting: Family Medicine

## 2018-08-06 ENCOUNTER — Other Ambulatory Visit: Payer: Self-pay

## 2018-08-06 ENCOUNTER — Ambulatory Visit (INDEPENDENT_AMBULATORY_CARE_PROVIDER_SITE_OTHER): Payer: BLUE CROSS/BLUE SHIELD | Admitting: Family Medicine

## 2018-08-06 VITALS — BP 126/74 | HR 95 | Temp 98.0°F | Wt 177.6 lb

## 2018-08-06 DIAGNOSIS — G4700 Insomnia, unspecified: Secondary | ICD-10-CM

## 2018-08-06 DIAGNOSIS — E291 Testicular hypofunction: Secondary | ICD-10-CM | POA: Diagnosis not present

## 2018-08-06 MED ORDER — TESTOSTERONE 20.25 MG/1.25GM (1.62%) TD GEL: 2.0000 | Freq: Every day | TRANSDERMAL | 2 refills | Status: DC

## 2018-08-06 MED ORDER — TEMAZEPAM 22.5 MG PO CAPS: 22.5000 mg | ORAL_CAPSULE | Freq: Every evening | ORAL | 2 refills | Status: DC | PRN

## 2018-08-06 NOTE — Progress Notes (Signed)
   Subjective:    Patient ID: Ryan Olsen, male    DOB: 03-05-1981, 38 y.o.   MRN: [id number]  HPI Here to review several issues. First we had prescribed Androgel to apply 4 pumps daily a few months ago but his insurance company denied this (apparently the dosage was a problem). He then saw [facility] and was tried on Clomiphene, with poor results. He then saw Dr. Gildardo Griffes at [facility] but she seemed to think he did not have a problem. He has contacted [facility] as well, and he is waiting from them to make him an appt. He now asks if he can try a lower dose of the Androgel. He also asks if he can try a lower dose of Temazepam.     Review of Systems  Constitutional: Positive for diaphoresis.  Respiratory: Negative.   Cardiovascular: Negative.   Neurological: Positive for numbness.       Objective:   Physical Exam Cardiovascular:     Rate and Rhythm: Normal rate and regular rhythm.     Pulses: Normal pulses.     Heart sounds: Normal heart sounds.  Pulmonary:     Effort: Pulmonary effort is normal.     Breath sounds: Normal breath sounds.  Neurological:     General: No focal deficit present.     Mental Status: He is alert.           Assessment & Plan:  For sleep he will try Temazepam 22.5 mg qhs. We will also resend the Androgel to apply 2 pumps per day instead of 4, and hopefully his insurance will cover this.  Gershon Crane, MD

## 2018-08-09 ENCOUNTER — Other Ambulatory Visit: Payer: Self-pay | Admitting: Family Medicine

## 2018-08-09 ENCOUNTER — Telehealth: Payer: Self-pay

## 2018-08-09 NOTE — Telephone Encounter (Signed)
Call in Androgel, to apply daily, one bottle with 5 rf

## 2018-08-09 NOTE — Telephone Encounter (Signed)
Dr. Clent Ridges did you want to change the temazepam from 30 mg  To the 22.5 mg?

## 2018-08-09 NOTE — Telephone Encounter (Signed)
Copied from CRM [id number]. Topic: General - Other >> Aug 09, 2018  9:10 AM Jaquita Rector A wrote: Reason for CRM: Dan with Pharmacy called to confirm that Rx for temazepam (RESTORIL) 22.5 MG capsule is being decreased to the now Rx that doctor Clent Ridges want patient to be taking. Please call Dan at Ph# [phone]

## 2018-08-09 NOTE — Telephone Encounter (Signed)
Called and spoke with the pharmacy.  Ryan Olsen was already gone for the day.  They stated that they have already filled this and sent it out to the pt.

## 2018-08-09 NOTE — Telephone Encounter (Signed)
Dr. Clent Ridges please advise on the testosterone rx.

## 2018-08-09 NOTE — Telephone Encounter (Signed)
Dr. Fry please advise on refill. thanks 

## 2018-08-09 NOTE — Telephone Encounter (Signed)
I have called the pharmacy and they stated that the testosterone requires a PA to be done.  They will fax the PA form over.  Will forward this over to Maynard.

## 2018-08-09 NOTE — Telephone Encounter (Signed)
Yes we decreased the dose to 22.5 mg

## 2018-08-10 ENCOUNTER — Other Ambulatory Visit: Payer: Self-pay | Admitting: Family Medicine

## 2018-08-10 ENCOUNTER — Telehealth: Payer: Self-pay | Admitting: Family Medicine

## 2018-08-10 NOTE — Telephone Encounter (Signed)
Patient called saying that he needs someone to complete a prior authorization for a testosterone pump.  Patient said that this has been on going since Nov of 2019. Patient has [facility]

## 2018-08-10 NOTE — Telephone Encounter (Signed)
PA has been sent to cover my meds.  Key: [id number]

## 2018-08-12 NOTE — Telephone Encounter (Signed)
Patient came in the office to ask about this. I spoke with the front staff and he was informed that if we send the Rx to another pharmacy I have to call and cancel the Rx that is at [facility] and by doing that the PA will cancel. Patient has decided to leave the Rx at [facility].

## 2018-08-12 NOTE — Telephone Encounter (Signed)
Patient states he needs the [facility] to [facility] [address] for this particular prescription.  He would like to be notified once the prior authorization goes through.

## 2018-08-16 ENCOUNTER — Telehealth: Payer: Self-pay | Admitting: Family Medicine

## 2018-08-16 NOTE — Telephone Encounter (Signed)
Patient called and said that he can't afford the Testerone  Medication he says that it is to expensive.  He also wants to cut down on the Temazepam because it didn't work for him.  Patient said that he wants to get back on Klonopin.   Phone number  is [phone]

## 2018-08-17 NOTE — Telephone Encounter (Signed)
Please cancel the testosterone and the Temazepam. He already has refills available for Clonazepam

## 2018-08-17 NOTE — Telephone Encounter (Signed)
Dr. Fry please advise. Thanks  

## 2018-08-18 ENCOUNTER — Telehealth: Payer: Self-pay | Admitting: Family Medicine

## 2018-08-18 NOTE — Telephone Encounter (Signed)
Shanda Bumps from [place] called stating that she is faxing the androgen form for completion;  she says that it should have the: lab results, collection time, and date; or the labs it can be submitted with the request; the information can be faxed to [phone].

## 2018-08-19 NOTE — Telephone Encounter (Signed)
Patient called requesting a call back from the nurse concerning the ongoing Prior Approval of his medication.  He also is requesting an extra 1mg  of Clonazepam because his current dose isn't lasting long enough so he can stay asleep

## 2018-08-19 NOTE — Telephone Encounter (Signed)
Noted.  Will keep an eye out for the form

## 2018-08-20 NOTE — Telephone Encounter (Signed)
Pt came in the office with his father Onalee Hua) and was requesting to speak with someone about his medication temazepam, clonzaePAM and ANDROGEL.  Pt is aware that we received the PA (ANDROGEL) from his insurance company ([employer]) on today (08/20/2018) and we will give it to Dr. Clent Ridges so that he can fill out after all of his visits today or early part of Monday.  Pt confirmed that he understands that.  Pt and Father stated that they were not aware of the pt requesting a change in pharmacies they stated that he said if it meant that they will be starting the PA from the beginning he does not want to [facility]   Pt would like to have clonzaePAM increased by 1 MG more since the dosage that he is taking is not helping him pt stated that he wakes up every 2 hours when he takes it and can not go back to sleep.  Pt would like to have a call on Monday or when the medication is sent in for the clonzaePAM and the PA has been completed.  While in the office the pt filled out a DPR for his parents Onalee Hua and Ariana Richwine) to get verbal information only and he stated that he understands.

## 2018-08-23 ENCOUNTER — Telehealth: Payer: Self-pay

## 2018-08-23 MED ORDER — CLONAZEPAM 1 MG PO TABS: 1.0000 mg | ORAL_TABLET | Freq: Every day | ORAL | 3 refills | Status: DC

## 2018-08-23 NOTE — Telephone Encounter (Signed)
I have called the pt and he is aware that we will have to do the PA for the androgel again. Papers are in the red folder to be signed by Dr. Clent Ridges.  I will follow up on that for the pt.

## 2018-08-23 NOTE — Telephone Encounter (Signed)
Forms have been received and faxed back to [facility]. Awaiting response from the insurance company.

## 2018-08-23 NOTE — Telephone Encounter (Signed)
Copied from CRM [id number]. Topic: General - Other >> Aug 20, 2018  1:33 PM Percival Spanish wrote: Pt called and left a voice mail said he has been waiting for 2 weeks for someone to call him back about his medication. He said there was suppose to be PA submitted for  the below medication and that none of sleep medication was working for him and he wanted to discuss getting a increase  he said he wanted a call back today     Testosterone (ANDROGEL) 20.25 MG/1.25GM (1.62%) GEL

## 2018-08-23 NOTE — Telephone Encounter (Signed)
Increase the dose of Clonazepam to a total of 3 mg at bedtime (to take one 2 mg and one 1 mg tablet). Call in 1 mg to take qhs, #30 with 3 rf

## 2018-08-23 NOTE — Addendum Note (Signed)
Addended by: Marcellus Scott on: 08/23/2018 02:28 PM   Modules accepted: Orders

## 2018-08-24 NOTE — Telephone Encounter (Signed)
Forms have been received. Please see other phone note.

## 2018-08-24 NOTE — Telephone Encounter (Signed)
More forms were received. They have been completed and faxed back

## 2018-08-25 NOTE — Telephone Encounter (Signed)
PA was approved. 

## 2018-08-25 NOTE — Telephone Encounter (Signed)
PA for the testosterone was approved by the insurance company.  The pharmacy is aware and they will order this for the pt and he can pick this up tomorrow.  Pt is aware.

## 2018-08-27 DIAGNOSIS — F411 Generalized anxiety disorder: Secondary | ICD-10-CM | POA: Diagnosis not present

## 2018-08-27 DIAGNOSIS — F331 Major depressive disorder, recurrent, moderate: Secondary | ICD-10-CM | POA: Diagnosis not present

## 2018-08-27 DIAGNOSIS — F900 Attention-deficit hyperactivity disorder, predominantly inattentive type: Secondary | ICD-10-CM | POA: Diagnosis not present

## 2018-08-27 DIAGNOSIS — F410 Panic disorder [episodic paroxysmal anxiety] without agoraphobia: Secondary | ICD-10-CM | POA: Diagnosis not present

## 2018-09-01 NOTE — Telephone Encounter (Signed)
Not sure if this has been taken care of, can't tell by chart.

## 2018-09-01 NOTE — Telephone Encounter (Signed)
PA was completed and approved for the pt.

## 2018-09-03 ENCOUNTER — Other Ambulatory Visit: Payer: Self-pay | Admitting: Family Medicine

## 2018-09-06 ENCOUNTER — Telehealth: Payer: Self-pay | Admitting: Family Medicine

## 2018-09-06 NOTE — Telephone Encounter (Signed)
See below

## 2018-09-06 NOTE — Telephone Encounter (Signed)
Dan the pharmacist from [facility] called requesting a call back for clarification of the medication Temazepam.  It's currently two prescriptions on file for this medication at the pharmacy so [name] wants to know what correct dosage to fill for the patient.      Call back number is [phone].    Thanks

## 2018-09-06 NOTE — Telephone Encounter (Signed)
Called and spoke with [name] and he state that the pt was requesting that the temazepam 30 mg  Be refilled.  I advised [name] that the most recent temazepam was the 22.5  And that the other refills for the 30 mg be removed.  [name] will take care of this.

## 2018-09-15 DIAGNOSIS — L02414 Cutaneous abscess of left upper limb: Secondary | ICD-10-CM | POA: Diagnosis not present

## 2018-09-17 DIAGNOSIS — L02414 Cutaneous abscess of left upper limb: Secondary | ICD-10-CM | POA: Diagnosis not present

## 2018-09-20 ENCOUNTER — Encounter (HOSPITAL_COMMUNITY): Payer: Self-pay

## 2018-09-20 ENCOUNTER — Ambulatory Visit (HOSPITAL_COMMUNITY)
Admission: EM | Admit: 2018-09-20 | Discharge: 2018-09-20 | Disposition: A | Discharge status: Home / Self Care | Payer: BLUE CROSS/BLUE SHIELD

## 2018-09-20 ENCOUNTER — Other Ambulatory Visit: Payer: Self-pay

## 2018-09-20 NOTE — ED Notes (Signed)
Patient stated to this RN on his way out of the UC that he has another appt he has to get to and could not wait any longer.

## 2018-09-20 NOTE — ED Triage Notes (Signed)
Pt cc he has a abscess that was opened up a week ago.pt wants to know if the abscess is healing properly. ( left forearm )

## 2018-09-21 DIAGNOSIS — L02414 Cutaneous abscess of left upper limb: Secondary | ICD-10-CM | POA: Diagnosis not present

## 2018-09-30 ENCOUNTER — Telehealth: Payer: Self-pay | Admitting: *Deleted

## 2018-09-30 DIAGNOSIS — E291 Testicular hypofunction: Secondary | ICD-10-CM

## 2018-09-30 NOTE — Telephone Encounter (Signed)
I ordered the test, he can make a lab appt

## 2018-09-30 NOTE — Addendum Note (Signed)
Addended by: Gershon Crane A on: 09/30/2018 09:02 AM   Modules accepted: Orders

## 2018-09-30 NOTE — Telephone Encounter (Signed)
Dr. Fry please advise. Thanks  

## 2018-09-30 NOTE — Telephone Encounter (Signed)
I have called the pt and left a detailed message for the pt to call back to schedule lab appt

## 2018-09-30 NOTE — Telephone Encounter (Signed)
Copied from CRM [id number]. Topic: General - Other >> Sep 30, 2018  7:26 AM Leafy Ro wrote: Reason for CRM: pt is on testosterone gel and would like to come in to have testosterone blood work done .

## 2018-10-04 ENCOUNTER — Other Ambulatory Visit: Payer: Self-pay

## 2018-10-04 ENCOUNTER — Other Ambulatory Visit (INDEPENDENT_AMBULATORY_CARE_PROVIDER_SITE_OTHER): Payer: BLUE CROSS/BLUE SHIELD

## 2018-10-04 DIAGNOSIS — E291 Testicular hypofunction: Secondary | ICD-10-CM

## 2018-10-04 LAB — TESTOSTERONE: Testosterone: 1288.64 ng/dL — ABNORMAL HIGH (ref 300.00–890.00)

## 2018-10-05 ENCOUNTER — Other Ambulatory Visit: Payer: Self-pay | Admitting: Family Medicine

## 2018-10-06 ENCOUNTER — Encounter: Payer: Self-pay | Admitting: Family Medicine

## 2018-10-06 ENCOUNTER — Telehealth: Payer: Self-pay | Admitting: *Deleted

## 2018-10-06 NOTE — Telephone Encounter (Signed)
Copied from CRM [id number]. Topic: Quick Communication - Lab Results (Clinic Use ONLY) >> Oct 06, 2018 11:55 AM Crist Infante wrote: Pt calling for results of labs done 10/04/18.  I set pt up on mychart if you want to respond to him that way.

## 2018-10-12 NOTE — Telephone Encounter (Signed)
For the Temazepam, call the pharmacy and cancel all remaining refills. For the Zolpidem, he was given a 6 month supply in February so refills are available.

## 2018-10-13 ENCOUNTER — Encounter: Payer: Self-pay | Admitting: *Deleted

## 2018-10-13 NOTE — Telephone Encounter (Signed)
See my answer  

## 2018-10-21 ENCOUNTER — Other Ambulatory Visit: Payer: Self-pay | Admitting: Family Medicine

## 2018-10-22 ENCOUNTER — Other Ambulatory Visit: Payer: Self-pay | Admitting: Family Medicine

## 2018-10-25 ENCOUNTER — Other Ambulatory Visit: Payer: Self-pay | Admitting: Family Medicine

## 2018-10-25 NOTE — Telephone Encounter (Signed)
Dr. Fry please advise. Thanks  

## 2018-10-25 NOTE — Telephone Encounter (Signed)
Done

## 2018-10-27 NOTE — Telephone Encounter (Signed)
Dr. Fry please advise on refill. Thanks  

## 2018-10-28 NOTE — Telephone Encounter (Signed)
Call in #30 with 5 rf 

## 2018-11-02 NOTE — Telephone Encounter (Signed)
Called to the pharmacy.

## 2018-11-10 ENCOUNTER — Other Ambulatory Visit: Payer: Self-pay | Admitting: Family Medicine

## 2018-11-15 ENCOUNTER — Telehealth: Payer: Self-pay | Admitting: Family Medicine

## 2018-11-15 NOTE — Telephone Encounter (Signed)
REFILL Testosterone (ANDROGEL) 20.25 MG/1.25GM (1.62%) GEL  PHARMACY [id number] - [place], [place] [phone] (Phone) [phone] (Fax)

## 2018-11-15 NOTE — Telephone Encounter (Signed)
Dr. Fry please advise on refill. Thanks  

## 2018-11-16 ENCOUNTER — Other Ambulatory Visit: Payer: Self-pay | Admitting: Family Medicine

## 2018-11-16 NOTE — Telephone Encounter (Signed)
Called to the pharmacy and left on the VM

## 2018-11-16 NOTE — Telephone Encounter (Signed)
Please refill this for 6 months  

## 2018-11-27 ENCOUNTER — Other Ambulatory Visit: Payer: Self-pay | Admitting: Family Medicine

## 2018-12-07 NOTE — Telephone Encounter (Signed)
Patient calling to find out why his Gabapentin request has been pending for over a week?

## 2018-12-09 DIAGNOSIS — F900 Attention-deficit hyperactivity disorder, predominantly inattentive type: Secondary | ICD-10-CM | POA: Diagnosis not present

## 2018-12-09 DIAGNOSIS — F411 Generalized anxiety disorder: Secondary | ICD-10-CM | POA: Diagnosis not present

## 2018-12-09 DIAGNOSIS — F3342 Major depressive disorder, recurrent, in full remission: Secondary | ICD-10-CM | POA: Diagnosis not present

## 2018-12-14 ENCOUNTER — Other Ambulatory Visit: Payer: Self-pay | Admitting: Family Medicine

## 2018-12-20 ENCOUNTER — Telehealth: Payer: Self-pay | Admitting: Family Medicine

## 2018-12-20 NOTE — Telephone Encounter (Signed)
[facility]  [id number] [place] [place] [address] [phone] (Phone) [phone] (Fax)  Called to advise that pt is getting clonazePAM (KLONOPIN) 2 MG tablet from this office and being prescribed a 1MG  tablet by a different doctor

## 2018-12-20 NOTE — Telephone Encounter (Signed)
Please advise. I spoke with the pharmacy and confirmed  that the patient does get Clonazepam 1mg  filled by another provider and a different pharmacy.

## 2018-12-21 NOTE — Telephone Encounter (Signed)
PMP has been placed in your green folder

## 2018-12-21 NOTE — Telephone Encounter (Signed)
Please print out a PMP Aware report for me to look at

## 2018-12-22 NOTE — Telephone Encounter (Signed)
[facility] [address] [facility] cancelled the bupropion 300mg  and clonazepam 2mg .  Called [facility] [address] and cancelled the Temazepam 22.5mg .   Patient is aware that these medications need to be prescribed by Dr. Toy Care.  Nothing further needed.

## 2018-12-22 NOTE — Telephone Encounter (Signed)
After a review of his PMP Aware report, I have decided that Ryan Olsen needs to get all his medications for depression, anxiety, and insomnia from Dr. Chucky May (his psychiatrist). Therefore I will no longer prescribe him any Bupropion or Clonazepam or Temazepam. Please call the pharmacy and CANCEL all remaining refills of these medications. The only meds left that should be in my name are Gabapentin, Omeprazole, and Androgel.

## 2019-01-28 ENCOUNTER — Ambulatory Visit (HOSPITAL_COMMUNITY)
Admission: EM | Admit: 2019-01-28 | Discharge: 2019-01-28 | Disposition: A | Discharge status: Home / Self Care | Payer: BLUE CROSS/BLUE SHIELD

## 2019-01-28 ENCOUNTER — Other Ambulatory Visit: Payer: Self-pay
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
## 2019-01-30 DIAGNOSIS — L03312 Cellulitis of back [any part except buttock]: Secondary | ICD-10-CM | POA: Diagnosis not present

## 2019-01-30 DIAGNOSIS — S30810A Abrasion of lower back and pelvis, initial encounter: Secondary | ICD-10-CM | POA: Diagnosis not present

## 2019-01-30 DIAGNOSIS — S40212A Abrasion of left shoulder, initial encounter: Secondary | ICD-10-CM | POA: Diagnosis not present

## 2019-01-30 DIAGNOSIS — F1910 Other psychoactive substance abuse, uncomplicated: Secondary | ICD-10-CM | POA: Diagnosis not present

## 2019-01-30 DIAGNOSIS — Z041 Encounter for examination and observation following transport accident: Secondary | ICD-10-CM | POA: Diagnosis not present

## 2019-01-30 DIAGNOSIS — G8911 Acute pain due to trauma: Secondary | ICD-10-CM | POA: Diagnosis not present

## 2019-01-30 DIAGNOSIS — S4992XA Unspecified injury of left shoulder and upper arm, initial encounter: Secondary | ICD-10-CM | POA: Diagnosis not present

## 2019-01-30 DIAGNOSIS — E291 Testicular hypofunction: Secondary | ICD-10-CM | POA: Diagnosis not present

## 2019-01-30 DIAGNOSIS — S70312A Abrasion, left thigh, initial encounter: Secondary | ICD-10-CM | POA: Diagnosis not present

## 2019-01-30 DIAGNOSIS — S90415A Abrasion, left lesser toe(s), initial encounter: Secondary | ICD-10-CM | POA: Diagnosis not present

## 2019-01-30 DIAGNOSIS — L03119 Cellulitis of unspecified part of limb: Secondary | ICD-10-CM | POA: Diagnosis not present

## 2019-01-30 DIAGNOSIS — R52 Pain, unspecified: Secondary | ICD-10-CM | POA: Diagnosis not present

## 2019-01-30 DIAGNOSIS — F419 Anxiety disorder, unspecified: Secondary | ICD-10-CM | POA: Diagnosis not present

## 2019-01-30 DIAGNOSIS — L03116 Cellulitis of left lower limb: Secondary | ICD-10-CM | POA: Diagnosis not present

## 2019-01-30 DIAGNOSIS — F329 Major depressive disorder, single episode, unspecified: Secondary | ICD-10-CM | POA: Diagnosis not present

## 2019-01-30 DIAGNOSIS — Z87891 Personal history of nicotine dependence: Secondary | ICD-10-CM | POA: Diagnosis not present

## 2019-01-30 DIAGNOSIS — Z79899 Other long term (current) drug therapy: Secondary | ICD-10-CM | POA: Diagnosis not present
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
## 2019-02-16 ENCOUNTER — Encounter: Payer: Self-pay | Admitting: Family Medicine

## 2019-02-16 ENCOUNTER — Other Ambulatory Visit: Payer: Self-pay

## 2019-02-16 ENCOUNTER — Telehealth (INDEPENDENT_AMBULATORY_CARE_PROVIDER_SITE_OTHER): Payer: BC Managed Care – PPO | Admitting: Family Medicine

## 2019-02-16 DIAGNOSIS — L03032 Cellulitis of left toe: Secondary | ICD-10-CM

## 2019-02-16 MED ORDER — SILVER SULFADIAZINE 1 % EX CREA: 1.0000 "application " | TOPICAL_CREAM | Freq: Two times a day (BID) | CUTANEOUS | 1 refills | Status: DC

## 2019-02-16 MED ORDER — CLINDAMYCIN HCL 300 MG PO CAPS: 300.0000 mg | ORAL_CAPSULE | Freq: Three times a day (TID) | ORAL | 0 refills | Status: DC
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
## 2019-02-20 ENCOUNTER — Other Ambulatory Visit: Payer: Self-pay | Admitting: Family Medicine

## 2019-03-04 DIAGNOSIS — F900 Attention-deficit hyperactivity disorder, predominantly inattentive type: Secondary | ICD-10-CM | POA: Diagnosis not present

## 2019-03-04 DIAGNOSIS — F410 Panic disorder [episodic paroxysmal anxiety] without agoraphobia: Secondary | ICD-10-CM | POA: Diagnosis not present

## 2019-03-04 DIAGNOSIS — F1210 Cannabis abuse, uncomplicated: Secondary | ICD-10-CM | POA: Diagnosis not present

## 2019-04-06 ENCOUNTER — Other Ambulatory Visit: Payer: Self-pay | Admitting: Family Medicine

## 2019-05-07 ENCOUNTER — Other Ambulatory Visit: Payer: Self-pay | Admitting: Family Medicine

## 2019-05-21 ENCOUNTER — Other Ambulatory Visit: Payer: Self-pay | Admitting: Family Medicine

## 2019-05-23 NOTE — Telephone Encounter (Signed)
Last filled 11/16/2018 Last OV 02/16/2019  Ok to fill?

## 2019-05-24 ENCOUNTER — Telehealth: Payer: Self-pay

## 2019-05-24 DIAGNOSIS — E291 Testicular hypofunction: Secondary | ICD-10-CM

## 2019-05-24 NOTE — Telephone Encounter (Signed)
Copied from CRM [id number]. Topic: General - Inquiry >> May 24, 2019  2:59 PM Reggie Pile, NT wrote: Reason for CRM: Pt called in stating he would like to have his testosterone levels checked soon. Please advise.

## 2019-05-24 NOTE — Telephone Encounter (Addendum)
Per 10/04/2018 labs patient is to recheck in 6 months.   Testosterone lab ordered.

## 2019-05-24 NOTE — Addendum Note (Signed)
Addended by: Solon Augusta on: 05/24/2019 03:32 PM   Modules accepted: Orders

## 2019-05-24 NOTE — Telephone Encounter (Signed)
Spoke with patient. Lab appointment has been scheduled.

## 2019-05-26 ENCOUNTER — Other Ambulatory Visit: Payer: Self-pay

## 2019-05-26 ENCOUNTER — Other Ambulatory Visit: Payer: BC Managed Care – PPO

## 2019-05-26 DIAGNOSIS — E291 Testicular hypofunction: Secondary | ICD-10-CM | POA: Diagnosis not present

## 2019-06-01 LAB — TESTOSTERONE, FREE: TESTOSTERONE FREE: 54.7 pg/mL (ref 46.0–224.0)

## 2019-06-06 ENCOUNTER — Telehealth: Payer: Self-pay

## 2019-06-06 NOTE — Telephone Encounter (Signed)
Please advise  Testosterone was at 54.7 on 05/26/2019 Do you have any recommendations for the patient?

## 2019-06-06 NOTE — Telephone Encounter (Signed)
Copied from CRM [id number]. Topic: General - Other >> Jun 06, 2019  1:55 PM Dalphine Handing A wrote: Patient would like a callback from [place] to go over test results. Please advise

## 2019-06-07 NOTE — Telephone Encounter (Signed)
Some patients have trouble absorbing the medication through the skin. He may have better results with injections. We can change to this if he wants

## 2019-06-07 NOTE — Telephone Encounter (Signed)
Spoke with patient. He is aware the his labs are normal. Patient stated that he does not feel any different since starting the gel and would Dr. Claris Che recommendations.

## 2019-06-07 NOTE — Telephone Encounter (Signed)
This is in the normal range, so continue with his current meds

## 2019-07-01 ENCOUNTER — Other Ambulatory Visit: Payer: Self-pay | Admitting: Family Medicine

## 2019-07-14 DIAGNOSIS — F1121 Opioid dependence, in remission: Secondary | ICD-10-CM | POA: Diagnosis not present

## 2019-09-22 DIAGNOSIS — F1210 Cannabis abuse, uncomplicated: Secondary | ICD-10-CM | POA: Diagnosis not present

## 2019-09-22 DIAGNOSIS — F410 Panic disorder [episodic paroxysmal anxiety] without agoraphobia: Secondary | ICD-10-CM | POA: Diagnosis not present

## 2019-09-22 DIAGNOSIS — F1310 Sedative, hypnotic or anxiolytic abuse, uncomplicated: Secondary | ICD-10-CM | POA: Diagnosis not present

## 2019-10-09 DIAGNOSIS — F1121 Opioid dependence, in remission: Secondary | ICD-10-CM | POA: Diagnosis not present

## 2019-11-04 ENCOUNTER — Other Ambulatory Visit: Payer: Self-pay | Admitting: Family Medicine

## 2019-11-23 ENCOUNTER — Other Ambulatory Visit: Payer: Self-pay | Admitting: Family Medicine

## 2019-11-24 NOTE — Telephone Encounter (Signed)
Okay for refill?   LOV 01/2019  Last refill 75 g 5 refill

## 2020-01-12 DIAGNOSIS — F410 Panic disorder [episodic paroxysmal anxiety] without agoraphobia: Secondary | ICD-10-CM | POA: Diagnosis not present

## 2020-01-12 DIAGNOSIS — F1121 Opioid dependence, in remission: Secondary | ICD-10-CM | POA: Diagnosis not present

## 2020-01-12 DIAGNOSIS — F3181 Bipolar II disorder: Secondary | ICD-10-CM | POA: Diagnosis not present

## 2020-01-12 DIAGNOSIS — F900 Attention-deficit hyperactivity disorder, predominantly inattentive type: Secondary | ICD-10-CM | POA: Diagnosis not present

## 2020-01-31 DIAGNOSIS — F900 Attention-deficit hyperactivity disorder, predominantly inattentive type: Secondary | ICD-10-CM | POA: Diagnosis not present

## 2020-01-31 DIAGNOSIS — F3181 Bipolar II disorder: Secondary | ICD-10-CM | POA: Diagnosis not present

## 2020-01-31 DIAGNOSIS — F410 Panic disorder [episodic paroxysmal anxiety] without agoraphobia: Secondary | ICD-10-CM | POA: Diagnosis not present

## 2020-02-10 DIAGNOSIS — F1121 Opioid dependence, in remission: Secondary | ICD-10-CM | POA: Diagnosis not present

## 2020-03-09 DIAGNOSIS — F1121 Opioid dependence, in remission: Secondary | ICD-10-CM | POA: Diagnosis not present

## 2020-03-11 DIAGNOSIS — F1121 Opioid dependence, in remission: Secondary | ICD-10-CM | POA: Diagnosis not present

## 2020-03-16 ENCOUNTER — Telehealth: Payer: Self-pay | Admitting: Family Medicine

## 2020-03-16 NOTE — Telephone Encounter (Signed)
No, we cannot run tests for non-[facility] providers here. He can take the order to a [facility] free standing lab to have this drawn

## 2020-03-16 NOTE — Telephone Encounter (Signed)
The patient is needing an appointment for labs from [facility], Dr. Milagros Evener, MD  [phone] FAX   She would like to have the patient do labs here for CBC/CMP liver function test  She wrote it on a Rx pad for the patient to bring to the office to have done.  Can you place this order for the patient?  Please advise

## 2020-03-19 NOTE — Telephone Encounter (Signed)
Left message on machine for patient to return our call 

## 2020-03-23 DIAGNOSIS — Z79899 Other long term (current) drug therapy: Secondary | ICD-10-CM | POA: Diagnosis not present

## 2020-03-23 DIAGNOSIS — Z130 Encounter for screening for diseases of the blood and blood-forming organs and certain disorders involving the immune mechanism: Secondary | ICD-10-CM | POA: Diagnosis not present

## 2020-03-26 DIAGNOSIS — F1121 Opioid dependence, in remission: Secondary | ICD-10-CM | POA: Diagnosis not present

## 2020-04-10 DIAGNOSIS — F1121 Opioid dependence, in remission: Secondary | ICD-10-CM | POA: Diagnosis not present

## 2020-04-30 DIAGNOSIS — F1121 Opioid dependence, in remission: Secondary | ICD-10-CM | POA: Diagnosis not present

## 2020-05-01 ENCOUNTER — Other Ambulatory Visit: Payer: Self-pay | Admitting: Family Medicine

## 2020-05-08 DIAGNOSIS — F1121 Opioid dependence, in remission: Secondary | ICD-10-CM | POA: Diagnosis not present

## 2020-05-28 DIAGNOSIS — F1121 Opioid dependence, in remission: Secondary | ICD-10-CM | POA: Diagnosis not present

## 2020-06-12 DIAGNOSIS — F1121 Opioid dependence, in remission: Secondary | ICD-10-CM | POA: Diagnosis not present

## 2020-06-26 DIAGNOSIS — F1121 Opioid dependence, in remission: Secondary | ICD-10-CM | POA: Diagnosis not present

## 2020-07-10 DIAGNOSIS — F4010 Social phobia, unspecified: Secondary | ICD-10-CM | POA: Diagnosis not present

## 2020-07-10 DIAGNOSIS — F900 Attention-deficit hyperactivity disorder, predominantly inattentive type: Secondary | ICD-10-CM | POA: Diagnosis not present

## 2020-07-10 DIAGNOSIS — F1121 Opioid dependence, in remission: Secondary | ICD-10-CM | POA: Diagnosis not present

## 2020-07-10 DIAGNOSIS — F3189 Other bipolar disorder: Secondary | ICD-10-CM | POA: Diagnosis not present

## 2020-07-11 ENCOUNTER — Other Ambulatory Visit: Payer: Self-pay | Admitting: Family Medicine

## 2020-07-11 NOTE — Telephone Encounter (Signed)
Last refill- 11/28/2019 Last office visit---02/16/2019  Lab labs--05/26/2019  No future appointment scheduled.

## 2020-07-24 DIAGNOSIS — F3189 Other bipolar disorder: Secondary | ICD-10-CM | POA: Diagnosis not present

## 2020-07-24 DIAGNOSIS — F4010 Social phobia, unspecified: Secondary | ICD-10-CM | POA: Diagnosis not present

## 2020-07-24 DIAGNOSIS — F900 Attention-deficit hyperactivity disorder, predominantly inattentive type: Secondary | ICD-10-CM | POA: Diagnosis not present

## 2020-07-30 DIAGNOSIS — F1121 Opioid dependence, in remission: Secondary | ICD-10-CM | POA: Diagnosis not present

## 2020-08-14 DIAGNOSIS — F1121 Opioid dependence, in remission: Secondary | ICD-10-CM | POA: Diagnosis not present

## 2020-08-27 DIAGNOSIS — F1121 Opioid dependence, in remission: Secondary | ICD-10-CM | POA: Diagnosis not present

## 2020-09-04 DIAGNOSIS — F1121 Opioid dependence, in remission: Secondary | ICD-10-CM | POA: Diagnosis not present

## 2020-09-11 DIAGNOSIS — F1121 Opioid dependence, in remission: Secondary | ICD-10-CM | POA: Diagnosis not present

## 2020-09-24 DIAGNOSIS — F1121 Opioid dependence, in remission: Secondary | ICD-10-CM | POA: Diagnosis not present

## 2020-10-26 DIAGNOSIS — F1121 Opioid dependence, in remission: Secondary | ICD-10-CM | POA: Diagnosis not present

## 2020-10-26 DIAGNOSIS — F1120 Opioid dependence, uncomplicated: Secondary | ICD-10-CM | POA: Diagnosis not present

## 2020-11-01 DIAGNOSIS — F1121 Opioid dependence, in remission: Secondary | ICD-10-CM | POA: Diagnosis not present

## 2020-12-04 DIAGNOSIS — F1121 Opioid dependence, in remission: Secondary | ICD-10-CM | POA: Diagnosis not present

## 2020-12-11 DIAGNOSIS — F1121 Opioid dependence, in remission: Secondary | ICD-10-CM | POA: Diagnosis not present

## 2020-12-17 ENCOUNTER — Other Ambulatory Visit: Payer: Self-pay | Admitting: Family Medicine

## 2021-01-02 DIAGNOSIS — F1121 Opioid dependence, in remission: Secondary | ICD-10-CM | POA: Diagnosis not present

## 2021-01-08 DIAGNOSIS — F1121 Opioid dependence, in remission: Secondary | ICD-10-CM | POA: Diagnosis not present

## 2021-01-14 ENCOUNTER — Other Ambulatory Visit: Payer: Self-pay | Admitting: Family Medicine

## 2021-01-18 DIAGNOSIS — F900 Attention-deficit hyperactivity disorder, predominantly inattentive type: Secondary | ICD-10-CM | POA: Diagnosis not present

## 2021-01-18 DIAGNOSIS — F341 Dysthymic disorder: Secondary | ICD-10-CM | POA: Diagnosis not present

## 2021-01-18 DIAGNOSIS — F410 Panic disorder [episodic paroxysmal anxiety] without agoraphobia: Secondary | ICD-10-CM | POA: Diagnosis not present

## 2021-02-11 DIAGNOSIS — F1121 Opioid dependence, in remission: Secondary | ICD-10-CM | POA: Diagnosis not present

## 2021-02-26 DIAGNOSIS — F1121 Opioid dependence, in remission: Secondary | ICD-10-CM | POA: Diagnosis not present

## 2021-03-06 ENCOUNTER — Other Ambulatory Visit: Payer: Self-pay | Admitting: Family Medicine

## 2021-03-13 DIAGNOSIS — F1121 Opioid dependence, in remission: Secondary | ICD-10-CM | POA: Diagnosis not present

## 2021-03-20 DIAGNOSIS — F1121 Opioid dependence, in remission: Secondary | ICD-10-CM | POA: Diagnosis not present

## 2021-04-10 DIAGNOSIS — F1121 Opioid dependence, in remission: Secondary | ICD-10-CM | POA: Diagnosis not present

## 2021-04-15 DIAGNOSIS — F1120 Opioid dependence, uncomplicated: Secondary | ICD-10-CM | POA: Diagnosis not present

## 2021-04-15 DIAGNOSIS — F1121 Opioid dependence, in remission: Secondary | ICD-10-CM | POA: Diagnosis not present

## 2021-05-06 DIAGNOSIS — F1121 Opioid dependence, in remission: Secondary | ICD-10-CM | POA: Diagnosis not present

## 2021-05-27 DIAGNOSIS — F1121 Opioid dependence, in remission: Secondary | ICD-10-CM | POA: Diagnosis not present

## 2021-06-03 DIAGNOSIS — F1120 Opioid dependence, uncomplicated: Secondary | ICD-10-CM | POA: Diagnosis not present

## 2021-07-01 DIAGNOSIS — F1121 Opioid dependence, in remission: Secondary | ICD-10-CM | POA: Diagnosis not present

## 2021-07-09 DIAGNOSIS — F1121 Opioid dependence, in remission: Secondary | ICD-10-CM | POA: Diagnosis not present

## 2021-07-18 DIAGNOSIS — F5101 Primary insomnia: Secondary | ICD-10-CM | POA: Diagnosis not present

## 2021-07-18 DIAGNOSIS — F900 Attention-deficit hyperactivity disorder, predominantly inattentive type: Secondary | ICD-10-CM | POA: Diagnosis not present

## 2021-07-18 DIAGNOSIS — F3341 Major depressive disorder, recurrent, in partial remission: Secondary | ICD-10-CM | POA: Diagnosis not present

## 2021-07-18 DIAGNOSIS — F410 Panic disorder [episodic paroxysmal anxiety] without agoraphobia: Secondary | ICD-10-CM | POA: Diagnosis not present

## 2021-07-31 DIAGNOSIS — F1121 Opioid dependence, in remission: Secondary | ICD-10-CM | POA: Diagnosis not present

## 2021-08-02 ENCOUNTER — Encounter: Payer: BC Managed Care – PPO | Admitting: Family Medicine

## 2021-08-05 DIAGNOSIS — F1121 Opioid dependence, in remission: Secondary | ICD-10-CM | POA: Diagnosis not present

## 2021-08-28 DIAGNOSIS — F1121 Opioid dependence, in remission: Secondary | ICD-10-CM | POA: Diagnosis not present

## 2021-09-03 DIAGNOSIS — F1121 Opioid dependence, in remission: Secondary | ICD-10-CM | POA: Diagnosis not present

## 2021-09-25 DIAGNOSIS — F1121 Opioid dependence, in remission: Secondary | ICD-10-CM | POA: Diagnosis not present

## 2021-10-01 DIAGNOSIS — F1121 Opioid dependence, in remission: Secondary | ICD-10-CM | POA: Diagnosis not present

## 2021-10-28 DIAGNOSIS — F1121 Opioid dependence, in remission: Secondary | ICD-10-CM | POA: Diagnosis not present

## 2021-11-04 DIAGNOSIS — F1121 Opioid dependence, in remission: Secondary | ICD-10-CM | POA: Diagnosis not present

## 2021-12-02 DIAGNOSIS — F1121 Opioid dependence, in remission: Secondary | ICD-10-CM | POA: Diagnosis not present

## 2021-12-16 DIAGNOSIS — F1121 Opioid dependence, in remission: Secondary | ICD-10-CM | POA: Diagnosis not present

## 2021-12-16 DIAGNOSIS — F1120 Opioid dependence, uncomplicated: Secondary | ICD-10-CM | POA: Diagnosis not present

## 2021-12-17 ENCOUNTER — Ambulatory Visit (INDEPENDENT_AMBULATORY_CARE_PROVIDER_SITE_OTHER): Payer: BC Managed Care – PPO

## 2021-12-17 ENCOUNTER — Encounter: Payer: Self-pay | Admitting: Family Medicine

## 2021-12-17 ENCOUNTER — Ambulatory Visit (INDEPENDENT_AMBULATORY_CARE_PROVIDER_SITE_OTHER): Payer: BC Managed Care – PPO | Admitting: Family Medicine

## 2021-12-17 VITALS — BP 118/70 | HR 102 | Temp 98.4°F | Ht 67.0 in | Wt 132.4 lb

## 2021-12-17 DIAGNOSIS — F900 Attention-deficit hyperactivity disorder, predominantly inattentive type: Secondary | ICD-10-CM | POA: Insufficient documentation

## 2021-12-17 DIAGNOSIS — F1911 Other psychoactive substance abuse, in remission: Secondary | ICD-10-CM | POA: Insufficient documentation

## 2021-12-17 DIAGNOSIS — Z209 Contact with and (suspected) exposure to unspecified communicable disease: Secondary | ICD-10-CM

## 2021-12-17 DIAGNOSIS — F4310 Post-traumatic stress disorder, unspecified: Secondary | ICD-10-CM

## 2021-12-17 DIAGNOSIS — F429 Obsessive-compulsive disorder, unspecified: Secondary | ICD-10-CM

## 2021-12-17 DIAGNOSIS — H93233 Hyperacusis, bilateral: Secondary | ICD-10-CM

## 2021-12-17 DIAGNOSIS — E291 Testicular hypofunction: Secondary | ICD-10-CM

## 2021-12-17 DIAGNOSIS — R634 Abnormal weight loss: Secondary | ICD-10-CM | POA: Diagnosis not present

## 2021-12-17 DIAGNOSIS — Z0000 Encounter for general adult medical examination without abnormal findings: Secondary | ICD-10-CM | POA: Diagnosis not present

## 2021-12-17 LAB — CBC WITH DIFFERENTIAL/PLATELET
Basophils Absolute: 0 10*3/uL (ref 0.0–0.1)
Basophils Relative: 0.5 % (ref 0.0–3.0)
Eosinophils Absolute: 0.1 10*3/uL (ref 0.0–0.7)
Eosinophils Relative: 0.8 % (ref 0.0–5.0)
HCT: 37.4 % — ABNORMAL LOW (ref 39.0–52.0)
Hemoglobin: 13 g/dL (ref 13.0–17.0)
Lymphocytes Relative: 8.4 % — ABNORMAL LOW (ref 12.0–46.0)
Lymphs Abs: 0.6 10*3/uL — ABNORMAL LOW (ref 0.7–4.0)
MCHC: 34.7 g/dL (ref 30.0–36.0)
MCV: 87.2 fl (ref 78.0–100.0)
Monocytes Absolute: 0.5 10*3/uL (ref 0.1–1.0)
Monocytes Relative: 6.4 % (ref 3.0–12.0)
Neutro Abs: 6.3 10*3/uL (ref 1.4–7.7)
Neutrophils Relative %: 83.9 % — ABNORMAL HIGH (ref 43.0–77.0)
Platelets: 305 10*3/uL (ref 150.0–400.0)
RBC: 4.29 Mil/uL (ref 4.22–5.81)
RDW: 12.9 % (ref 11.5–15.5)
WBC: 7.5 10*3/uL (ref 4.0–10.5)

## 2021-12-17 LAB — HEPATIC FUNCTION PANEL
ALT: 13 U/L (ref 0–53)
AST: 17 U/L (ref 0–37)
Albumin: 4.2 g/dL (ref 3.5–5.2)
Alkaline Phosphatase: 67 U/L (ref 39–117)
Bilirubin, Direct: 0.1 mg/dL (ref 0.0–0.3)
Total Bilirubin: 0.3 mg/dL (ref 0.2–1.2)
Total Protein: 6.6 g/dL (ref 6.0–8.3)

## 2021-12-17 LAB — TESTOSTERONE: Testosterone: 139.6 ng/dL — ABNORMAL LOW (ref 300.00–890.00)

## 2021-12-17 LAB — PSA: PSA: 0.47 ng/mL (ref 0.10–4.00)

## 2021-12-17 LAB — LIPID PANEL
Cholesterol: 154 mg/dL (ref 0–200)
HDL: 68.3 mg/dL (ref >39.00)
LDL Cholesterol: 77 mg/dL (ref 0–99)
NonHDL: 85.78
Total CHOL/HDL Ratio: 2
Triglycerides: 44 mg/dL (ref 0.0–149.0)
VLDL: 8.8 mg/dL (ref 0.0–40.0)

## 2021-12-17 LAB — BASIC METABOLIC PANEL
BUN: 8 mg/dL (ref 6–23)
CO2: 29 mEq/L (ref 19–32)
Calcium: 9.1 mg/dL (ref 8.4–10.5)
Chloride: 102 mEq/L (ref 96–112)
Creatinine, Ser: 0.76 mg/dL (ref 0.40–1.50)
GFR: 112.18 mL/min (ref >60.00)
Glucose, Bld: 87 mg/dL (ref 70–99)
Potassium: 4.5 mEq/L (ref 3.5–5.1)
Sodium: 139 mEq/L (ref 135–145)

## 2021-12-17 LAB — T4, FREE: Free T4: 0.66 ng/dL (ref 0.60–1.60)

## 2021-12-17 LAB — TSH: TSH: 1.12 u[IU]/mL (ref 0.35–5.50)

## 2021-12-17 LAB — HEMOGLOBIN A1C: Hgb A1c MFr Bld: 5.8 % (ref 4.6–6.5)

## 2021-12-17 LAB — T3, FREE: T3, Free: 3.7 pg/mL (ref 2.3–4.2)

## 2021-12-17 MED ORDER — GABAPENTIN 600 MG PO TABS: 600.0000 mg | ORAL_TABLET | Freq: Four times a day (QID) | ORAL | 0 refills | Status: AC

## 2021-12-17 MED ORDER — CLONAZEPAM 2 MG PO TABS: 2.0000 mg | ORAL_TABLET | Freq: Four times a day (QID) | ORAL | 0 refills | Status: DC

## 2021-12-17 MED ORDER — SUBLOCADE 300 MG/1.5ML ~~LOC~~ SOSY
300.0000 mg | PREFILLED_SYRINGE | SUBCUTANEOUS | 0 refills | Status: DC
Start: 2021-12-17 — End: 2023-08-08

## 2021-12-17 MED ORDER — AMPHETAMINE-DEXTROAMPHETAMINE 30 MG PO TABS: 30.0000 mg | ORAL_TABLET | Freq: Two times a day (BID) | ORAL | 0 refills | Status: DC

## 2021-12-17 MED ORDER — DOXEPIN HCL 150 MG PO CAPS: 150.0000 mg | ORAL_CAPSULE | Freq: Every day | ORAL | 0 refills | Status: DC

## 2021-12-17 NOTE — Progress Notes (Signed)
Subjective:    Patient ID: Ryan Olsen, male    DOB: [birth year], 41 y.o.   MRN: [id number]  HPI Here for a well exam after an absence of almost 3 years. He has been seeing his psychiatrist, Dr. Milagros Evener, during this time. She has been adjusting his medications until he is now taking the medications noted in his chart. He says he feels bad all the time, and this has been going on for about a year. He has a very poor appetite, and he has lost about 50 lbs since we saw him in 2020. This has been unintentional. He feels tired all the time, and he has trouble sleeping. His moods have been fairly well managed by the current medications, but he finds it very difficult to focus on things and to get anything done that he wants to do. In fact he has been talking to [employer] to find out how he can apply for disability. He has also been having frequent headaches that are centered in both temples and in the back of the head. He also has trouble urinating. When he feels the need to urinate, he can only pass a slow stream, and he usually feels like his bladder is not fully emptying itself. There is no pain or burning. He notes that his father has terminal prostate cancer, and his paternal GF also had this. He is proud to say that he has been sober and clean from illicit drugs for the past 10 years.    Review of Systems  Constitutional:  Positive for appetite change, fatigue and unexpected weight change.  HENT: Negative.    Eyes: Negative.   Respiratory: Negative.    Cardiovascular: Negative.   Gastrointestinal: Negative.   Endocrine: Negative.   Genitourinary:  Positive for difficulty urinating and frequency. Negative for dysuria, flank pain, genital sores, hematuria and urgency.  Musculoskeletal: Negative.   Neurological:  Positive for headaches. Negative for dizziness, tremors, seizures, syncope, facial asymmetry, speech difficulty, weakness, light-headedness and numbness.  Hematological:  Negative.   Psychiatric/Behavioral:  Positive for decreased concentration, dysphoric mood and sleep disturbance. Negative for agitation, behavioral problems, confusion, hallucinations, self-injury and suicidal ideas. The patient is not nervous/anxious.        Objective:   Physical Exam Constitutional:      General: He is not in acute distress.    Appearance: He is well-developed. He is not diaphoretic.     Comments: He is quite thin   HENT:     Head: Normocephalic and atraumatic.     Right Ear: External ear normal.     Left Ear: External ear normal.     Nose: Nose normal.     Mouth/Throat:     Pharynx: Oropharynx is clear. No oropharyngeal exudate.  Eyes:     General: No scleral icterus.       Right eye: No discharge.        Left eye: No discharge.     Conjunctiva/sclera: Conjunctivae normal.     Pupils: Pupils are equal, round, and reactive to light.  Neck:     Thyroid: No thyromegaly.     Vascular: No JVD.     Trachea: No tracheal deviation.  Cardiovascular:     Rate and Rhythm: Normal rate and regular rhythm.     Pulses: Normal pulses.     Heart sounds: Normal heart sounds. No murmur heard.    No friction rub. No gallop.  Pulmonary:     Effort: Pulmonary  effort is normal. No respiratory distress.     Breath sounds: Normal breath sounds. No wheezing or rales.  Chest:     Chest wall: No tenderness.  Abdominal:     General: Abdomen is flat. Bowel sounds are normal. There is no distension.     Palpations: Abdomen is soft. There is no mass.     Tenderness: There is no abdominal tenderness. There is no guarding or rebound.     Hernia: No hernia is present.  Genitourinary:    Penis: Normal. No tenderness.      Testes: Normal.     Prostate: Normal.     Rectum: Normal. Guaiac result negative.  Musculoskeletal:        General: No tenderness. Normal range of motion.     Cervical back: Neck supple.  Lymphadenopathy:     Cervical: No cervical adenopathy.  Skin:    General:  Skin is warm and dry.     Coloration: Skin is not pale.     Findings: No erythema or rash.  Neurological:     Mental Status: He is alert and oriented to person, place, and time.     Cranial Nerves: No cranial nerve deficit.     Motor: No abnormal muscle tone.     Coordination: Coordination normal.     Gait: Gait normal.     Deep Tendon Reflexes: Reflexes are normal and symmetric. Reflexes normal.  Psychiatric:        Mood and Affect: Mood normal.        Behavior: Behavior normal.        Thought Content: Thought content normal.        Judgment: Judgment normal.           Assessment & Plan:  Well exam. We discussed diet and exercise. Get fasting labs today. For the unexplained weight loss we will check a PSA and get a CXR today. We will check other fasting labs. He will follow up with Dr. Evelene Croon for psychiatric care.  Gershon Crane, MD

## 2021-12-18 LAB — HIV ANTIBODY (ROUTINE TESTING W REFLEX): HIV 1&2 Ab, 4th Generation: NONREACTIVE

## 2021-12-18 LAB — HEPATITIS C ANTIBODY: Hepatitis C Ab: NONREACTIVE

## 2021-12-18 LAB — HEPATITIS B SURFACE ANTIGEN: Hepatitis B Surface Ag: NONREACTIVE

## 2021-12-27 ENCOUNTER — Telehealth: Payer: Self-pay

## 2021-12-27 MED ORDER — TESTOSTERONE CYPIONATE 200 MG/ML IM SOLN: 200.0000 mg | INTRAMUSCULAR | 0 refills | Status: DC

## 2021-12-27 MED ORDER — "LUER LOCK SAFETY SYRINGES 22G X 1-1/2"" 3 ML MISC": 1.0000 | 5 refills | Status: AC

## 2021-12-27 NOTE — Addendum Note (Signed)
Addended by: Gershon Crane A on: 12/27/2021 04:29 PM   Modules accepted: Orders

## 2021-12-27 NOTE — Telephone Encounter (Signed)
Pt message sent to  PCP for advise ?

## 2021-12-30 DIAGNOSIS — F1121 Opioid dependence, in remission: Secondary | ICD-10-CM | POA: Diagnosis not present

## 2021-12-30 MED ORDER — TESTOSTERONE CYPIONATE 200 MG/ML IM SOLN: 200.0000 mg | INTRAMUSCULAR | 0 refills | Status: DC

## 2021-12-30 NOTE — Telephone Encounter (Signed)
Spoke with patient aware prescription sent to [facility] pharmacy.

## 2021-12-30 NOTE — Telephone Encounter (Signed)
Done

## 2021-12-30 NOTE — Addendum Note (Signed)
Addended by: Gershon Crane A on: 12/30/2021 10:15 AM   Modules accepted: Orders

## 2022-01-02 ENCOUNTER — Telehealth: Payer: Self-pay

## 2022-01-02 NOTE — Telephone Encounter (Signed)
Key: [facility]

## 2022-01-03 NOTE — Telephone Encounter (Signed)
Pt is calling and PA needs to be for generic testosterone cypionate (DEPOTESTnotOSTERONE CYPIONATE) 200 MG/ML injection not name brand

## 2022-01-13 DIAGNOSIS — F1120 Opioid dependence, uncomplicated: Secondary | ICD-10-CM | POA: Diagnosis not present

## 2022-01-13 DIAGNOSIS — F1121 Opioid dependence, in remission: Secondary | ICD-10-CM | POA: Diagnosis not present

## 2022-01-13 NOTE — Telephone Encounter (Signed)
Testosterone Cypionate 200MG /ML intramuscular solution approved 01/02/22-01/01/2023  Attempted to call pt to let him know.

## 2022-02-03 DIAGNOSIS — F1121 Opioid dependence, in remission: Secondary | ICD-10-CM | POA: Diagnosis not present

## 2022-02-03 NOTE — Telephone Encounter (Signed)
Pt PA was approved, message sent to Dr Sarajane Jews to resend Testosterone Rx to [facility] pharmacy

## 2022-02-04 NOTE — Telephone Encounter (Signed)
Noted  

## 2022-02-12 DIAGNOSIS — F1121 Opioid dependence, in remission: Secondary | ICD-10-CM | POA: Diagnosis not present

## 2022-02-12 DIAGNOSIS — F1120 Opioid dependence, uncomplicated: Secondary | ICD-10-CM | POA: Diagnosis not present

## 2022-03-10 DIAGNOSIS — F1120 Opioid dependence, uncomplicated: Secondary | ICD-10-CM | POA: Diagnosis not present

## 2022-03-10 DIAGNOSIS — F1121 Opioid dependence, in remission: Secondary | ICD-10-CM | POA: Diagnosis not present

## 2022-03-24 ENCOUNTER — Other Ambulatory Visit: Payer: Self-pay | Admitting: Family Medicine

## 2022-03-24 NOTE — Telephone Encounter (Signed)
Last OV CPE-12/17/21 Last refill- 12/30/21  No future Ov scheduled.

## 2022-03-24 NOTE — Telephone Encounter (Signed)
Let's check a testosterone level first to see where we are

## 2022-04-17 DIAGNOSIS — F410 Panic disorder [episodic paroxysmal anxiety] without agoraphobia: Secondary | ICD-10-CM | POA: Diagnosis not present

## 2022-04-17 DIAGNOSIS — F900 Attention-deficit hyperactivity disorder, predominantly inattentive type: Secondary | ICD-10-CM | POA: Diagnosis not present

## 2022-04-17 DIAGNOSIS — F3181 Bipolar II disorder: Secondary | ICD-10-CM | POA: Diagnosis not present

## 2022-04-22 DIAGNOSIS — F1120 Opioid dependence, uncomplicated: Secondary | ICD-10-CM | POA: Diagnosis not present

## 2022-04-22 DIAGNOSIS — F1121 Opioid dependence, in remission: Secondary | ICD-10-CM | POA: Diagnosis not present

## 2022-05-05 ENCOUNTER — Other Ambulatory Visit: Payer: Self-pay | Admitting: Family Medicine

## 2022-05-05 NOTE — Telephone Encounter (Signed)
Last OV CPE-12/17/21 Last refill-12/30/21  No future OV scheduled.

## 2022-05-14 DIAGNOSIS — F1120 Opioid dependence, uncomplicated: Secondary | ICD-10-CM | POA: Diagnosis not present

## 2022-05-14 DIAGNOSIS — F1121 Opioid dependence, in remission: Secondary | ICD-10-CM | POA: Diagnosis not present

## 2022-05-28 DIAGNOSIS — F1121 Opioid dependence, in remission: Secondary | ICD-10-CM | POA: Diagnosis not present

## 2022-05-28 DIAGNOSIS — F1120 Opioid dependence, uncomplicated: Secondary | ICD-10-CM | POA: Diagnosis not present

## 2022-06-02 DIAGNOSIS — F1120 Opioid dependence, uncomplicated: Secondary | ICD-10-CM | POA: Diagnosis not present

## 2022-06-11 DIAGNOSIS — F1121 Opioid dependence, in remission: Secondary | ICD-10-CM | POA: Diagnosis not present

## 2022-06-11 DIAGNOSIS — F1120 Opioid dependence, uncomplicated: Secondary | ICD-10-CM | POA: Diagnosis not present

## 2022-07-31 DIAGNOSIS — F3342 Major depressive disorder, recurrent, in full remission: Secondary | ICD-10-CM | POA: Diagnosis not present

## 2022-07-31 DIAGNOSIS — F900 Attention-deficit hyperactivity disorder, predominantly inattentive type: Secondary | ICD-10-CM | POA: Diagnosis not present

## 2022-07-31 DIAGNOSIS — F411 Generalized anxiety disorder: Secondary | ICD-10-CM | POA: Diagnosis not present

## 2022-10-21 ENCOUNTER — Other Ambulatory Visit: Payer: Self-pay | Admitting: Family Medicine

## 2023-02-02 DIAGNOSIS — F3342 Major depressive disorder, recurrent, in full remission: Secondary | ICD-10-CM | POA: Diagnosis not present

## 2023-02-02 DIAGNOSIS — F900 Attention-deficit hyperactivity disorder, predominantly inattentive type: Secondary | ICD-10-CM | POA: Diagnosis not present

## 2023-02-02 DIAGNOSIS — F4322 Adjustment disorder with anxiety: Secondary | ICD-10-CM | POA: Diagnosis not present

## 2023-02-02 DIAGNOSIS — F410 Panic disorder [episodic paroxysmal anxiety] without agoraphobia: Secondary | ICD-10-CM | POA: Diagnosis not present

## 2023-04-09 ENCOUNTER — Other Ambulatory Visit: Payer: Self-pay | Admitting: Family Medicine

## 2023-04-09 NOTE — Telephone Encounter (Signed)
Pt LOV was 8/1/123 Pt last refill done on 10/23/22 Please advise

## 2023-07-20 ENCOUNTER — Encounter: Payer: BC Managed Care – PPO | Admitting: Family Medicine

## 2023-08-07 DIAGNOSIS — T50904A Poisoning by unspecified drugs, medicaments and biological substances, undetermined, initial encounter: Secondary | ICD-10-CM | POA: Diagnosis not present

## 2023-08-07 DIAGNOSIS — T887XXA Unspecified adverse effect of drug or medicament, initial encounter: Secondary | ICD-10-CM | POA: Diagnosis not present

## 2023-08-08 ENCOUNTER — Encounter (HOSPITAL_COMMUNITY): Payer: Self-pay | Admitting: Emergency Medicine

## 2023-08-08 ENCOUNTER — Emergency Department (HOSPITAL_COMMUNITY)
Admission: EM | Admit: 2023-08-08 | Discharge: 2023-08-08 | Disposition: A | Discharge status: Home / Self Care | Attending: Emergency Medicine | Admitting: Emergency Medicine

## 2023-08-08 ENCOUNTER — Emergency Department (EMERGENCY_DEPARTMENT_HOSPITAL)
Admission: EM | Admit: 2023-08-08 | Discharge: 2023-08-10 | Disposition: A | Discharge status: Other Acute Inpatient Hospital | Source: Home / Self Care | Attending: Emergency Medicine | Admitting: Emergency Medicine

## 2023-08-08 ENCOUNTER — Other Ambulatory Visit: Payer: Self-pay

## 2023-08-08 DIAGNOSIS — E871 Hypo-osmolality and hyponatremia: Secondary | ICD-10-CM | POA: Insufficient documentation

## 2023-08-08 DIAGNOSIS — F339 Major depressive disorder, recurrent, unspecified: Secondary | ICD-10-CM | POA: Insufficient documentation

## 2023-08-08 DIAGNOSIS — F1210 Cannabis abuse, uncomplicated: Secondary | ICD-10-CM | POA: Diagnosis present

## 2023-08-08 DIAGNOSIS — F1420 Cocaine dependence, uncomplicated: Secondary | ICD-10-CM | POA: Insufficient documentation

## 2023-08-08 DIAGNOSIS — F1910 Other psychoactive substance abuse, uncomplicated: Secondary | ICD-10-CM

## 2023-08-08 DIAGNOSIS — F430 Acute stress reaction: Secondary | ICD-10-CM

## 2023-08-08 DIAGNOSIS — F1520 Other stimulant dependence, uncomplicated: Secondary | ICD-10-CM | POA: Diagnosis present

## 2023-08-08 DIAGNOSIS — F1920 Other psychoactive substance dependence, uncomplicated: Secondary | ICD-10-CM | POA: Diagnosis not present

## 2023-08-08 DIAGNOSIS — F1510 Other stimulant abuse, uncomplicated: Secondary | ICD-10-CM

## 2023-08-08 DIAGNOSIS — F411 Generalized anxiety disorder: Secondary | ICD-10-CM | POA: Insufficient documentation

## 2023-08-08 DIAGNOSIS — F1914 Other psychoactive substance abuse with psychoactive substance-induced mood disorder: Secondary | ICD-10-CM | POA: Diagnosis not present

## 2023-08-08 DIAGNOSIS — F1994 Other psychoactive substance use, unspecified with psychoactive substance-induced mood disorder: Secondary | ICD-10-CM

## 2023-08-08 DIAGNOSIS — F1410 Cocaine abuse, uncomplicated: Secondary | ICD-10-CM

## 2023-08-08 DIAGNOSIS — F4325 Adjustment disorder with mixed disturbance of emotions and conduct: Secondary | ICD-10-CM | POA: Diagnosis not present

## 2023-08-08 DIAGNOSIS — R9431 Abnormal electrocardiogram [ECG] [EKG]: Secondary | ICD-10-CM | POA: Diagnosis not present

## 2023-08-08 LAB — CBC WITH DIFFERENTIAL/PLATELET
Abs Immature Granulocytes: 0.01 10*3/uL (ref 0.00–0.07)
Basophils Absolute: 0 10*3/uL (ref 0.0–0.1)
Basophils Relative: 0 %
Eosinophils Absolute: 0.1 10*3/uL (ref 0.0–0.5)
Eosinophils Relative: 2 %
HCT: 42.8 % (ref 39.0–52.0)
Hemoglobin: 14.9 g/dL (ref 13.0–17.0)
Immature Granulocytes: 0 %
Lymphocytes Relative: 14 %
Lymphs Abs: 0.8 10*3/uL (ref 0.7–4.0)
MCH: 30.8 pg (ref 26.0–34.0)
MCHC: 34.8 g/dL (ref 30.0–36.0)
MCV: 88.6 fL (ref 80.0–100.0)
Monocytes Absolute: 0.7 10*3/uL (ref 0.1–1.0)
Monocytes Relative: 12 %
Neutro Abs: 3.8 10*3/uL (ref 1.7–7.7)
Neutrophils Relative %: 72 %
Platelets: 293 10*3/uL (ref 150–400)
RBC: 4.83 MIL/uL (ref 4.22–5.81)
RDW: 11.4 % — ABNORMAL LOW (ref 11.5–15.5)
WBC: 5.3 10*3/uL (ref 4.0–10.5)
nRBC: 0 % (ref 0.0–0.2)

## 2023-08-08 LAB — RAPID URINE DRUG SCREEN, HOSP PERFORMED
Amphetamines: POSITIVE — AB
Barbiturates: NOT DETECTED
Benzodiazepines: NOT DETECTED
Cocaine: POSITIVE — AB
Opiates: NOT DETECTED
Tetrahydrocannabinol: POSITIVE — AB

## 2023-08-08 LAB — COMPREHENSIVE METABOLIC PANEL
ALT: 15 U/L (ref 0–44)
AST: 23 U/L (ref 15–41)
Albumin: 4.1 g/dL (ref 3.5–5.0)
Alkaline Phosphatase: 70 U/L (ref 38–126)
Anion gap: 7 (ref 5–15)
BUN: 20 mg/dL (ref 6–20)
CO2: 26 mmol/L (ref 22–32)
Calcium: 9 mg/dL (ref 8.9–10.3)
Chloride: 100 mmol/L (ref 98–111)
Creatinine, Ser: 0.79 mg/dL (ref 0.61–1.24)
GFR, Estimated: >60 mL/min (ref >60)
Glucose, Bld: 80 mg/dL (ref 70–99)
Potassium: 3.6 mmol/L (ref 3.5–5.1)
Sodium: 133 mmol/L — ABNORMAL LOW (ref 135–145)
Total Bilirubin: 0.8 mg/dL (ref 0.0–1.2)
Total Protein: 7.2 g/dL (ref 6.5–8.1)

## 2023-08-08 LAB — SALICYLATE LEVEL: Salicylate Lvl: <7 mg/dL — ABNORMAL LOW (ref 7.0–30.0)

## 2023-08-08 LAB — ETHANOL: Alcohol, Ethyl (B): <10 mg/dL (ref <10)

## 2023-08-08 LAB — ACETAMINOPHEN LEVEL: Acetaminophen (Tylenol), Serum: <10 ug/mL — ABNORMAL LOW (ref 10–30)

## 2023-08-08 MED ORDER — ONDANSETRON 4 MG PO TBDP
4.0000 mg | ORAL_TABLET | Freq: Once | ORAL | Status: AC
Start: 2023-08-08 — End: 2023-08-08
  Administered 2023-08-08: 4 mg via ORAL
  Filled 2023-08-08: qty 1

## 2023-08-08 MED ORDER — LORAZEPAM 1 MG PO TABS
1.0000 mg | ORAL_TABLET | Freq: Once | ORAL | Status: AC
  Administered 2023-08-08: 1 mg via ORAL
  Filled 2023-08-08: qty 1

## 2023-08-08 NOTE — Discharge Instructions (Signed)
 Your labs today were all normal. You have multiple drugs in your system. See resource guide on back if you want help with drug addiction/detox. Return here for new concerns.

## 2023-08-08 NOTE — ED Provider Notes (Signed)
 [place] EMERGENCY DEPARTMENT AT [facility] Provider Note   CSN: [id number] Arrival date & time: 08/08/23  4098     History  Chief Complaint  Patient presents with   substance use disorder   Stress    Ryan Olsen is a 43 y.o. male.  Patient with hx substance use disorder. Patient was d/c'd earlier this AM and laid down in street.  Pt limited historian - level 5 caveat. Pt indicates wants something to eat and somewhere to stay. Pt denies specific physical complaint or injury. Denies desire/plan to harm self or others.   The history is provided by the patient and medical records.       Home Medications Prior to Admission medications   Medication Sig Start Date End Date Taking? Authorizing Provider  amphetamine-dextroamphetamine (ADDERALL) 30 MG tablet Take 1 tablet by mouth 2 (two) times daily. 12/17/21   Nelwyn Salisbury, MD  Buprenorphine ER (SUBLOCADE) 300 MG/1.5ML SOSY Inject 300 mg into the skin every 30 (thirty) days. 12/17/21   Nelwyn Salisbury, MD  clonazePAM (KLONOPIN) 2 MG tablet Take 1 tablet (2 mg total) by mouth in the morning, at noon, in the evening, and at bedtime. 12/17/21   Nelwyn Salisbury, MD  Cyanocobalamin (VITAMIN B 12 PO) Take daily by mouth.    [provider]  doxepin (SINEQUAN) 150 MG capsule Take 1 capsule (150 mg total) by mouth at bedtime. 12/17/21   Nelwyn Salisbury, MD  gabapentin (NEURONTIN) 600 MG tablet Take 1 tablet (600 mg total) by mouth in the morning, at noon, in the evening, and at bedtime. 12/17/21   Nelwyn Salisbury, MD  Magnesium 400 MG CAPS Take daily by mouth.    [provider]  SYRINGE-NEEDLE, DISP, 3 ML (LUER LOCK SAFETY SYRINGES) 22G X 1-1/2" 3 ML MISC 1 Application by Does not apply route every 14 (fourteen) days. 12/27/21   Nelwyn Salisbury, MD  testosterone cypionate (DEPOTESTOSTERONE CYPIONATE) 200 MG/ML injection INJECT 1 ML (200 MG TOTAL) INTO THE MUSCLE EVERY 14 (FOURTEEN) DAYS. 04/09/23   Nelwyn Salisbury, MD       Allergies    Patient has no known allergies.    Review of Systems   Review of Systems  Constitutional:  Negative for fever.  HENT:  Negative for sore throat.   Respiratory:  Negative for shortness of breath.   Cardiovascular:  Negative for chest pain.  Gastrointestinal:  Negative for abdominal pain and vomiting.  Genitourinary:  Negative for flank pain.  Musculoskeletal:  Negative for back pain and neck pain.  Skin:  Negative for wound.  Neurological:  Negative for headaches.  Psychiatric/Behavioral:  The patient is nervous/anxious.     Physical Exam Updated Vital Signs BP 135/86 (BP Location: Left Arm)   Pulse 60   Temp (!) 97.4 F (36.3 C) (Oral)   Resp 20   Ht 1.702 m (5\' 7" )   Wt 59 kg   SpO2 100%   BMI 20.36 kg/m  Physical Exam Vitals and nursing note reviewed.  Constitutional:      Appearance: Normal appearance. He is well-developed.  HENT:     Head: Atraumatic.     Nose: Nose normal.     Mouth/Throat:     Mouth: Mucous membranes are moist.     Pharynx: Oropharynx is clear.  Eyes:     General: No scleral icterus.    Conjunctiva/sclera: Conjunctivae normal.     Pupils: Pupils are equal, round, and  reactive to light.  Neck:     Trachea: No tracheal deviation.  Cardiovascular:     Rate and Rhythm: Normal rate and regular rhythm.     Pulses: Normal pulses.     Heart sounds: Normal heart sounds. No murmur heard.    No friction rub. No gallop.  Pulmonary:     Effort: Pulmonary effort is normal. No accessory muscle usage or respiratory distress.     Breath sounds: Normal breath sounds.  Abdominal:     General: There is no distension.     Palpations: Abdomen is soft.     Tenderness: There is no abdominal tenderness.  Musculoskeletal:        General: No swelling or tenderness.     Cervical back: Normal range of motion and neck supple. No rigidity.     Comments: CTLS spine, non tender, aligned, no step off. Good rom bil extremities without pain or focal  bony tenderness.  Skin:    General: Skin is warm and dry.     Findings: No rash.  Neurological:     Mental Status: He is alert.     Comments: Alert, speech clear. Motor/sens grossly intact bil. Steady gait.   Psychiatric:     Comments: Pt calm, resting. Pt does not appear acutely depressed or despondent.  He does express desire to have a place to rest, and something to eat/drink. No plan/desire to harm self or others. Pt does not appear to be responding to internal stimuli. No delusional thoughts noted. No hallucinations.      ED Results / Procedures / Treatments   Labs (all labs ordered are listed, but only abnormal results are displayed) Labs Reviewed - No data to display  EKG None  Radiology No results found.  Procedures Procedures    Medications Ordered in ED Medications  LORazepam (ATIVAN) tablet 1 mg (has no administration in time range)    ED Course/ Medical Decision Making/ A&P                                 Medical Decision Making Problems Addressed: Acute stress reaction causing mixed disturbance of emotion and conduct: acute illness or injury with systemic symptoms that poses a threat to life or bodily functions Cocaine abuse [facility]): acute illness or injury    Details: Acute/chronic Methamphetamine abuse (HCC): acute illness or injury with systemic symptoms that poses a threat to life or bodily functions    Details: Acute/chronic Polysubstance abuse (HCC): acute illness or injury with systemic symptoms that poses a threat to life or bodily functions    Details: Acute/chronic Substance induced mood disorder (HCC): acute illness or injury with systemic symptoms  Amount and/or Complexity of Data Reviewed Independent Historian:     Details: GOD, hx External Data Reviewed: labs and notes. Labs:  Decision-making details documented in ED Course. Discussion of management or test interpretation with external provider(s): BH team  Risk Prescription drug  management. Decision regarding hospitalization.   Recent labs reviewed/interpreted by me - chem unremarkable. Uds +amphetamines, cocaine, and thc.   Reviewed nursing notes and prior charts for additional history.  Earlier presentation appears c/w substance use disorder, and malingering type of behavior.   Po fluids/food provided.   Pt is encouraged to pursue substance use disorder treatment - resources provided.  Also provided [facility] as a 24/7 resource should patient feel needs additional behavioral health assistance/tx.   Observe in ED, metabolize  substances on board.   Ativan po (pt anxious). Reassurance also provided. Denies daily or frequent etoh use.   [facility] consult pending. Dispo per [facility] team.  The patient has been placed in psychiatric observation due to the need to provide a safe environment for the patient while obtaining psychiatric consultation and evaluation, as well as ongoing medical and medication management to treat the patient's condition.           Final Clinical Impression(s) / ED Diagnoses Final diagnoses:  None    Rx / DC Orders ED Discharge Orders     None         Cathren Laine, MD 08/08/23 1151

## 2023-08-08 NOTE — ED Notes (Signed)
 Pt able to eat and drink with no issues.

## 2023-08-08 NOTE — ED Notes (Signed)
 Pt ambulatory to bathroom

## 2023-08-08 NOTE — ED Triage Notes (Signed)
 BIBA Per EMS: Pt took crack, cocaine & other substances that he doesn't know of since yesterday. VSS  Dilated pupils  A&Ox4

## 2023-08-08 NOTE — Discharge Instructions (Signed)
It was our pleasure to provide your ER care today - we hope that you feel better.  Avoid drug use as it is harmful to your physical health and mental well-being. See resource guide attached in terms of accessing inpatient or outpatient substance use treatment programs.   Follow up closely with primary care doctor and behavioral health provider in the coming week.  For mental health issues and/or crisis, you may also go directly to the [facility] - they are open 24/7 and walk-ins are welcome.    Return to ER if worse, new symptoms, fevers, chest pain, trouble breathing, or other emergency concern.   

## 2023-08-08 NOTE — BH Assessment (Signed)
 Comprehensive Clinical Assessment (CCA) Note  08/08/2023 Ryan Olsen [id number]  DISPOSITION: Gave clinical report to Ryan Asper, NP who recommended Pt be observed and evaluated by psychiatry in the morning. Notified Dr. Linwood Dibbles of recommendation.  The patient demonstrates the following risk factors for suicide: Chronic risk factors for suicide include: psychiatric disorder of GAD and substance use disorder. Acute risk factors for suicide include: unemployment and social withdrawal/isolation. Protective factors for this patient include: hope for the future. Considering these factors, the overall suicide risk at this point appears to be high. Patient is not appropriate for outpatient follow up.  Per medical record, Pt arrived to the emergency department at midnight via EMS stating he had been on a bender the past few days-- crack, cocaine, and other substances that he cannot name. In the morning, when told he was being discharged he refused and stated, "I'm gonna kill everyone in this building if I have to leave." He was discharged and it was documented he was trying to break in through the ambulance bay.  Per medical record, after discharge Pt was lying in the street. He then checked in to the emergency department again stating he was suicidal with plan to jump in front of a bus. He also stated he was seeking substance abuse treatment. He was witnessed vomiting and retching during the day.  During TTS assessment, Pt appears somnolent, impaired, and is a poor historian. He stopped responding before all assessment questions could be asked. He reports using approximately $100 worth of cocaine daily, last use 2 days ago. He says he ingests approximately 5 tablets of Xanax daily, last use 1 week ago. He reports using opiates "sometimes" with last use 2 days ago. He also reports smoking marijuana "sometimes." Pt says he uses amphetamines 1-2 times per week, amount unknown. He denies abusing alcohol.  Pt's drug screen is positive for cocaine, amphetamines, and cannabis. Alcohol level is negative.  Pt describes his mood as depressed and anxious. He says he has been sleeping very little and that his appetite is poor. He endorses suicidal ideation and confirms he was lying in the street earlier today. He denies history of suicide attempts. He states he has thoughts of harming people "sometimes" and denies current plan or intent to harm anyone. When asked if he is experiencing auditory or visual hallucinations, Pt replies, "I don't know."   Pt says he is single and has no children. He states he does not have a place to stay and is unemployed. He says he has a court appearance pending for probation violation and does not know the date. He denies access to firearms.  Pt says his psychiatrist is Dr. Milagros Evener. He says he is diagnosed with anxiety and depression. He reports his medications are Xanax, Adderall, and gabapentin. He says he has received inpatient psychiatric treatment in the past but cannot remember the facility or date.  Pt is dressed in hospital scrubs. He appears somnolent with slurred, slow speech and slow movements. He groans at times. Eye contact is minimal and his eyes are closed. Pt's mood is depressed and anxious, affect is blunted. Thought process is coherent. Judgment is currently impaired and insight is limited. He is minimally cooperative.  Chief Complaint:  Chief Complaint  Patient presents with   substance use disorder   Stress   Visit Diagnosis:  F14.20 Cocaine use disorder, Severe F15.20 Amphetamine-type substance use disorder, Severe F41.1 Generalized anxiety disorder  CCA Screening, Triage and Referral (STR)  Patient  Reported Information How did you hear about [place]? Self  What Is the Reason for Your Visit/Call Today? Pt reports a history of anxiety and depression. He reports suicidal ideation and was lying in the street. He is abusing multiple substances and is  seeking substance abuse treatment.  How Long Has This Been Causing You Problems? > than 6 months  What Do You Feel Would Help You the Most Today? Alcohol or Drug Use Treatment   Have You Recently Had Any Thoughts About Hurting Yourself? Yes  Are You Planning to Commit Suicide/Harm Yourself At This time? Yes   Flowsheet Row ED from 08/08/2023 in [facility] Emergency Department at [facility] Most recent reading at 08/08/2023  7:09 AM ED from 08/08/2023 in [facility] Emergency Department at [facility] Most recent reading at 08/08/2023 12:20 AM  C-SSRS RISK CATEGORY Moderate Risk No Risk       Have you Recently Had Thoughts About Hurting Someone Ryan Olsen? Yes  Are You Planning to Harm Someone at This Time? No  Explanation: Pt reports suicidal ideation and today he was lying in the street. He also acknowledges thoughts of harming other and made verbal threats to kill people earlier today.   Have You Used Any Alcohol or Drugs in the Past 24 Hours? No  How Long Ago Did You Use Drugs or Alcohol? No data recorded What Did You Use and How Much? No data recorded  Do You Currently Have a Therapist/Psychiatrist? Yes  Name of Therapist/Psychiatrist: Name of Therapist/Psychiatrist: Milagros Evener, MD   Have You Been Recently Discharged From Any Office Practice or Programs? No  Explanation of Discharge From Practice/Program: No data recorded    CCA Screening Triage Referral Assessment Type of Contact: Tele-Assessment  Telemedicine Service Delivery: Telemedicine service delivery: This service was provided via telemedicine using a 2-way, interactive audio and video technology  Is this Initial or Reassessment? Is this Initial or Reassessment?: Initial Assessment  Date Telepsych consult ordered in [facility]:  Date Telepsych consult ordered in [facility]: 08/08/23  Time Telepsych consult ordered in [facility]:  Time Telepsych consult ordered in [facility]: 1148  Location of Assessment: [facility]  ED  Provider Location: [facility] Assessment Services   Collateral Involvement: None   Does Patient Have a Automotive engineer Guardian? No  Legal Guardian Contact Information: Pt does not have a legal guardian  Copy of Legal Guardianship Form: -- (Pt does not have a legal guardian)  Legal Guardian Notified of Arrival: -- (Pt does not have a legal guardian)  Legal Guardian Notified of Pending Discharge: -- (Pt does not have a legal guardian)  If Minor and Not Living with Parent(s), Who has Custody? Pt is an adult  Is CPS involved or ever been involved? Never  Is APS involved or ever been involved? Never   Patient Determined To Be At Risk for Harm To Self or Others Based on Review of Patient Reported Information or Presenting Complaint? Yes, for Self-Harm  Method: Plan with intent and identified person (Pt reports suicidal ideation and today he was lying in the street. He also acknowledges thoughts of harming other and made verbal threats to kill people earlier today.)  Availability of Means: In hand or used (Pt reports suicidal ideation and today he was lying in the street. He also acknowledges thoughts of harming other and made verbal threats to kill people earlier today.)  Intent: Clearly intends on inflicting harm that could cause death (Pt reports suicidal ideation and today he was  lying in the street. He also acknowledges thoughts of harming other and made verbal threats to kill people earlier today.)  Notification Required: No need or identified person  Additional Information for Danger to Others Potential: -- (NA)  Additional Comments for Danger to Others Potential: None  Are There Guns or Other Weapons in Your Home? No  Types of Guns/Weapons: Pt denies access to firearms.  Are These Weapons Safely Secured?                            -- (Pt denies access to firearms.)  Who Could Verify You Are Able To Have These Secured: Pt denies access to firearms.  Do You Have  any Outstanding Charges, Pending Court Dates, Parole/Probation? Pt has pending court appearance, date unknown, for probation violation.  Contacted To Inform of Risk of Harm To Self or Others: Other: Comment (NA)    Does Patient Present under Involuntary Commitment? No    [place] of Residence: [place]   Patient Currently Receiving the Following Services: Medication Management   Determination of Need: Emergent (2 hours)   Options For Referral: Inpatient Hospitalization; Facility-Based Crisis; BH Urgent Care     CCA Biopsychosocial Patient Reported Schizophrenia/Schizoaffective Diagnosis in Past: No   Strengths: Unable to assess   Mental Health Symptoms Depression:  Change in energy/activity; Difficulty Concentrating; Fatigue; Hopelessness; Increase/decrease in appetite; Irritability; Sleep (too much or little); Tearfulness; Weight gain/loss; Worthlessness   Duration of Depressive symptoms: Duration of Depressive Symptoms: Greater than two weeks   Mania:  Recklessness; Irritability; Change in energy/activity   Anxiety:   Difficulty concentrating; Fatigue; Irritability; Restlessness; Tension; Worrying; Sleep   Psychosis:  -- (Pt says he does not know if he is experiencing hallucinations.)   Duration of Psychotic symptoms:    Trauma:  -- (Pt is somnolent and stopped responding before question could be asked.)   Obsessions:  None   Compulsions:  None   Inattention:  N/A   Hyperactivity/Impulsivity:  N/A   Oppositional/Defiant Behaviors:  Angry   Emotional Irregularity:  Intense/unstable relationships   Other Mood/Personality Symptoms:  None noted    Mental Status Exam Appearance and self-care  Stature:  Average   Weight:  Average weight   Clothing:  -- (Scrubs)   Grooming:  Neglected   Cosmetic use:  None   Posture/gait:  Slumped   Motor activity:  Slowed   Sensorium  Attention:  Inattentive   Concentration:  -- (Decreased)   Orientation:   Person; Place; Situation; Time   Recall/memory:  Normal   Affect and Mood  Affect:  Blunted   Mood:  Anxious   Relating  Eye contact:  Fleeting   Facial expression:  Anxious; Tense   Attitude toward examiner:  Cooperative   Thought and Language  Speech flow: Slurred; Slow; Paucity   Thought content:  Appropriate to Mood and Circumstances   Preoccupation:  None   Hallucinations:  Other (Comment) (Pt says he does not know if he is experiencing hallucinations.)   Organization:  Circumstantial   Company secretary of Knowledge:  Average   Intelligence:  Average   Abstraction:  Normal   Judgement:  Impaired   Reality Testing:  Variable   Insight:  Fair   Decision Making:  Impulsive   Social Functioning  Social Maturity:  Impulsive   Social Judgement:  Normal   Stress  Stressors:  Housing; Office manager Ability:  Exhausted; Overwhelmed  Skill Deficits:  None   Supports:  Support needed     Religion: Religion/Spirituality Are You A Religious Person?:  (Pt is somnolent and stopped responding before question could be asked.) How Might This Affect Treatment?: NA  Leisure/Recreation: Leisure / Recreation Do You Have Hobbies?:  (Pt is somnolent and stopped responding before question could be asked.)  Exercise/Diet: Exercise/Diet Do You Exercise?:  (Pt is somnolent and stopped responding before question could be asked.) Have You Gained or Lost A Significant Amount of Weight in the Past Six Months?:  (Pt is somnolent and stopped responding before question could be asked.) Do You Follow a Special Diet?:  (Pt is somnolent and stopped responding before question could be asked.) Do You Have Any Trouble Sleeping?: Yes Explanation of Sleeping Difficulties: Pt says he has been sleeping very little   CCA Employment/Education Employment/Work Situation: Employment / Work Situation Employment Situation: Unemployed Patient's Job has Been Impacted by  Current Illness:  (Pt is somnolent and stopped responding before question could be asked.) Has Patient ever Been in the [employer]?:  (Pt is somnolent and stopped responding before question could be asked.)  Education: Education Is Patient Currently Attending School?: No Last Grade Completed:  (Pt is somnolent and stopped responding before question could be asked.) Did You Attend College?:  (Pt is somnolent and stopped responding before question could be asked.) Did You Have An Individualized Education Program (IIEP):  (Pt is somnolent and stopped responding before question could be asked.) Did You Have Any Difficulty At School?:  (Pt is somnolent and stopped responding before question could be asked.) Patient's Education Has Been Impacted by Current Illness:  (Pt is somnolent and stopped responding before question could be asked.)   CCA Family/Childhood History Family and Relationship History: Family history Marital status: Single Does patient have children?: No  Childhood History:  Childhood History By whom was/is the patient raised?: Other (Comment) (Pt is somnolent and stopped responding before question could be asked.) Did patient suffer any verbal/emotional/physical/sexual abuse as a child?:  (Pt is somnolent and stopped responding before question could be asked.) Did patient suffer from severe childhood neglect?:  (Pt is somnolent and stopped responding before question could be asked.) Has patient ever been sexually abused/assaulted/raped as an adolescent or adult?:  (Pt is somnolent and stopped responding before question could be asked.) Was the patient ever a victim of a crime or a disaster?:  (Pt is somnolent and stopped responding before question could be asked.) Witnessed domestic violence?:  (Pt is somnolent and stopped responding before question could be asked.) Has patient been affected by domestic violence as an adult?:  (Pt is somnolent and stopped responding before question  could be asked.)       CCA Substance Use Alcohol/Drug Use: Alcohol / Drug Use Pain Medications: Denies abuse Prescriptions: Pt reports using Xanax Over the Counter: Denies abuse History of alcohol / drug use?: Yes Longest period of sobriety (when/how long): Unknown Negative Consequences of Use: Financial, Legal, Personal relationships, Work / School Withdrawal Symptoms: None Substance #1 Name of Substance 1: Cocaine 1 - Age of First Use: Unknown 1 - Amount (size/oz): Approximately $100 worth 1 - Frequency: Daily when available 1 - Duration: Ongoing 1 - Last Use / Amount: 08/06/2023 1 - Method of Aquiring: Unknown 1- Route of Use: "every way" Substance #2 Name of Substance 2: Amphetamines 2 - Age of First Use: Unknown 2 - Amount (size/oz): Varies 2 - Frequency: 1-2 times per week 2 - Duration: Ongoing  2 - Last Use / Amount: 08/06/2023 2 - Method of Aquiring: Unknown 2 - Route of Substance Use: Unknown Substance #3 Name of Substance 3: Xanax 3 - Age of First Use: Unknown 3 - Amount (size/oz): Approximately 5 pills 3 - Frequency: Daily when available 3 - Duration: Ongoing 3 - Last Use / Amount: Unknown 3 - Method of Aquiring: 1 week ago 3 - Route of Substance Use: Unknown Substance #4 Name of Substance 4: Marijuana 4 - Age of First Use: Unknown 4 - Amount (size/oz): Varies 4 - Frequency: "just sometimes" 4 - Duration: Ongoing 4 - Last Use / Amount: Unknown 4 - Method of Aquiring: Unknown 4 - Route of Substance Use: Smoke inhalation                 ASAM's:  Six Dimensions of Multidimensional Assessment  Dimension 1:  Acute Intoxication and/or Withdrawal Potential:   Dimension 1:  Description of individual's past and current experiences of substance use and withdrawal: Pt reports using cocaine, amphetamines, Xanax and marijuana on a regular basis.  Dimension 2:  Biomedical Conditions and Complications:   Dimension 2:  Description of patient's biomedical  conditions and  complications: None  Dimension 3:  Emotional, Behavioral, or Cognitive Conditions and Complications:  Dimension 3:  Description of emotional, behavioral, or cognitive conditions and complications: Pt reports depression, anxiety, and suicidal ideation  Dimension 4:  Readiness to Change:  Dimension 4:  Description of Readiness to Change criteria: Unknown  Dimension 5:  Relapse, Continued use, or Continued Problem Potential:  Dimension 5:  Relapse, continued use, or continued problem potential critiera description: Unknown  Dimension 6:  Recovery/Living Environment:  Dimension 6:  Recovery/Iiving environment criteria description: Homeless  ASAM Severity Score: ASAM's Severity Rating Score: 10  ASAM Recommended Level of Treatment:     Substance use Disorder (SUD) Substance Use Disorder (SUD)  Checklist Symptoms of Substance Use: Continued use despite having a persistent/recurrent physical/psychological problem caused/exacerbated by use, Continued use despite persistent or recurrent social, interpersonal problems, caused or exacerbated by use, Large amounts of time spent to obtain, use or recover from the substance(s), Recurrent use that results in a failure to fulfill major role obligations (work, school, home), Substance(s) often taken in larger amounts or over longer times than was intended  Recommendations for Services/Supports/Treatments: Recommendations for Services/Supports/Treatments Recommendations For Services/Supports/Treatments: Inpatient Hospitalization, Facility Based Crisis  Disposition Recommendation per psychiatric provider: We recommend inpatient psychiatric hospitalization when medically cleared. Patient is under voluntary admission status at this time; please IVC if attempts to leave hospital.   DSM5 Diagnoses: Patient Active Problem List   Diagnosis Date Noted   ADHD (attention deficit hyperactivity disorder), inattentive type 12/17/2021   PTSD (post-traumatic  stress disorder) 12/17/2021   OCD (obsessive compulsive disorder) 12/17/2021   Hyperacusis, bilateral 12/17/2021   Hx of psychoactive drug abuse (HCC) 12/17/2021   Hypogonadism in male 08/06/2018   Neuropathy involving both lower extremities 03/23/2017   GERD (gastroesophageal reflux disease) 03/23/2017   Depression, major, single episode, moderate (HCC) 03/23/2017   Insomnia 03/23/2017   BPH with urinary obstruction 03/23/2017     Referrals to Alternative Service(s): Referred to Alternative Service(s):   Place:   Date:   Time:    Referred to Alternative Service(s):   Place:   Date:   Time:    Referred to Alternative Service(s):   Place:   Date:   Time:    Referred to Alternative Service(s):   Place:   Date:  Time:     Pamalee Leyden, [place]

## 2023-08-08 NOTE — ED Provider Notes (Signed)
 Patient discharged and refused to leave.  Then trying to break in through ambulance bay.     Eadie Repetto, MD 08/08/23 [phone]

## 2023-08-08 NOTE — Consult Note (Signed)
 Attempt to evaluate patient failed as he did not want to engage with provider.  Patient was vomiting and retching all afternoon.  Zofran was given.  Will evaluate when patient is stable.

## 2023-08-08 NOTE — ED Notes (Signed)
 Pt screaming loudly stating that his feet are cold.

## 2023-08-08 NOTE — ED Notes (Signed)
 Pt given meal tray, pt not eating.

## 2023-08-08 NOTE — ED Notes (Addendum)
 Pt informed of discharged status and given bus pass. Pt instructed to get dressed, but pt refused. Pt said "I'm gonna kill everyone in this building if I have to leave." Charge RN Abby informed, and she advised to call security to the room.

## 2023-08-08 NOTE — ED Notes (Signed)
 Pt completes his TTS consult. Cart removed from pt room.

## 2023-08-08 NOTE — ED Triage Notes (Signed)
 PT checking into day for SI. States having thoughts about hurting himself. D/C earlier today. States not having taken any drugs after getting d/c. States wanting to jump in front of a bus. States wanting to get clean. C/o headache

## 2023-08-08 NOTE — ED Notes (Signed)
 TTS consult arranged and cart rolled into pt room

## 2023-08-08 NOTE — ED Provider Notes (Signed)
 [place] EMERGENCY DEPARTMENT AT [facility] Provider Note   CSN: [id number] Arrival date & time: 08/08/23  0003     History  Chief Complaint  Patient presents with   Addiction Problem    Ryan Olsen is a 43 y.o. male.  The history is provided by the patient and medical records.   43 y.o. M with hx of ADHD, BPH, depression, GERD, OCD, PTSD, presenting to the ED for drug use.  Apparently has been on a bender the past few days-- crack, cocaine, and other substances that he cannot name.  Patient is not really giving any history other than "feeling hot" on his back and neck.  No rashes.  No fever/chills.  No cough or other infectious symptoms.    Denies SI/HI.  Home Medications Prior to Admission medications   Medication Sig Start Date End Date Taking? Authorizing Provider  amphetamine-dextroamphetamine (ADDERALL) 30 MG tablet Take 1 tablet by mouth 2 (two) times daily. 12/17/21   Nelwyn Salisbury, MD  Buprenorphine ER (SUBLOCADE) 300 MG/1.5ML SOSY Inject 300 mg into the skin every 30 (thirty) days. 12/17/21   Nelwyn Salisbury, MD  clonazePAM (KLONOPIN) 2 MG tablet Take 1 tablet (2 mg total) by mouth in the morning, at noon, in the evening, and at bedtime. 12/17/21   Nelwyn Salisbury, MD  Cyanocobalamin (VITAMIN B 12 PO) Take daily by mouth.    [provider]  doxepin (SINEQUAN) 150 MG capsule Take 1 capsule (150 mg total) by mouth at bedtime. 12/17/21   Nelwyn Salisbury, MD  gabapentin (NEURONTIN) 600 MG tablet Take 1 tablet (600 mg total) by mouth in the morning, at noon, in the evening, and at bedtime. 12/17/21   Nelwyn Salisbury, MD  Magnesium 400 MG CAPS Take daily by mouth.    [provider]  SYRINGE-NEEDLE, DISP, 3 ML (LUER LOCK SAFETY SYRINGES) 22G X 1-1/2" 3 ML MISC 1 Application by Does not apply route every 14 (fourteen) days. 12/27/21   Nelwyn Salisbury, MD  testosterone cypionate (DEPOTESTOSTERONE CYPIONATE) 200 MG/ML injection INJECT 1 ML (200 MG TOTAL) INTO  THE MUSCLE EVERY 14 (FOURTEEN) DAYS. 04/09/23   Nelwyn Salisbury, MD      Allergies    Patient has no known allergies.    Review of Systems   Review of Systems  Psychiatric/Behavioral:  Positive for behavioral problems.   All other systems reviewed and are negative.   Physical Exam Updated Vital Signs BP (!) 152/81 (BP Location: Right Arm)   Pulse 81   Temp 98.3 F (36.8 C) (Oral)   Resp 18   SpO2 97%  Physical Exam Vitals and nursing note reviewed.  Constitutional:      Appearance: He is well-developed.  HENT:     Head: Normocephalic and atraumatic.  Eyes:     Conjunctiva/sclera: Conjunctivae normal.     Pupils: Pupils are equal, round, and reactive to light.  Cardiovascular:     Rate and Rhythm: Normal rate and regular rhythm.     Heart sounds: Normal heart sounds.  Pulmonary:     Effort: Pulmonary effort is normal.     Breath sounds: Normal breath sounds.  Abdominal:     General: Bowel sounds are normal.     Palpations: Abdomen is soft.  Musculoskeletal:        General: Normal range of motion.     Cervical back: Normal range of motion.  Skin:    General: Skin is warm  and dry.     Comments: No visible rash, no erythema or other overlying skin changes  Neurological:     Mental Status: He is alert and oriented to person, place, and time.  Psychiatric:     Comments: Behaving erratically, constantly fidgeting during exam Denies SI/HI     ED Results / Procedures / Treatments   Labs (all labs ordered are listed, but only abnormal results are displayed) Labs Reviewed  CBC WITH DIFFERENTIAL/PLATELET - Abnormal; Notable for the following components:      Result Value   RDW 11.4 (*)    All other components within normal limits  COMPREHENSIVE METABOLIC PANEL - Abnormal; Notable for the following components:   Sodium 133 (*)    All other components within normal limits  RAPID URINE DRUG SCREEN, HOSP PERFORMED - Abnormal; Notable for the following components:    Cocaine POSITIVE (*)    Amphetamines POSITIVE (*)    Tetrahydrocannabinol POSITIVE (*)    All other components within normal limits  SALICYLATE LEVEL - Abnormal; Notable for the following components:   Salicylate Lvl <7.0 (*)    All other components within normal limits  ACETAMINOPHEN LEVEL - Abnormal; Notable for the following components:   Acetaminophen (Tylenol), Serum <10 (*)    All other components within normal limits  ETHANOL    EKG None  Radiology No results found.  Procedures Procedures    Medications Ordered in ED Medications - No data to display  ED Course/ Medical Decision Making/ A&P                                 Medical Decision Making Amount and/or Complexity of Data Reviewed Labs: ordered. ECG/medicine tests: ordered and independent interpretation performed.   43 year old male presenting to the ED after polysubstance abuse.  Has been using multiple substances recently including crack, cocaine.  He is quite erratic on exam, constantly fidgeting, unable to sit still.  He denies methamphetamine use but I suspect this may be contributing.  EKG without acute ischemic changes.  Labs as above--marginally low sodium at 133.  Negative ethanol.  UDS is positive for cocaine, amphetamines, and THC.  I suspect this is likely etiology of his erratic behavior.  He has been observed here for a few hours.  He remains HD stable.  Appropriate for discharge.  He is given OP resources if desiring help with substance abuse/detox.  Follow-up with PCP encouraged.  Can return here for new concerns.  Final Clinical Impression(s) / ED Diagnoses Final diagnoses:  Polysubstance abuse [facility])    Rx / DC Orders ED Discharge Orders     None         Garlon Hatchet, PA-C 08/08/23 0551    Nicanor Alcon, April, MD 08/08/23 [phone]

## 2023-08-09 DIAGNOSIS — F1914 Other psychoactive substance abuse with psychoactive substance-induced mood disorder: Secondary | ICD-10-CM | POA: Diagnosis not present

## 2023-08-09 DIAGNOSIS — F1420 Cocaine dependence, uncomplicated: Secondary | ICD-10-CM | POA: Diagnosis present

## 2023-08-09 DIAGNOSIS — F1520 Other stimulant dependence, uncomplicated: Secondary | ICD-10-CM | POA: Diagnosis present

## 2023-08-09 DIAGNOSIS — F1210 Cannabis abuse, uncomplicated: Secondary | ICD-10-CM | POA: Diagnosis present

## 2023-08-09 DIAGNOSIS — F1510 Other stimulant abuse, uncomplicated: Secondary | ICD-10-CM | POA: Diagnosis not present

## 2023-08-09 DIAGNOSIS — F1410 Cocaine abuse, uncomplicated: Secondary | ICD-10-CM | POA: Diagnosis not present

## 2023-08-09 DIAGNOSIS — F4325 Adjustment disorder with mixed disturbance of emotions and conduct: Secondary | ICD-10-CM | POA: Diagnosis not present

## 2023-08-09 MED ORDER — TRAZODONE HCL 50 MG PO TABS: 50.0000 mg | ORAL_TABLET | Freq: Every evening | ORAL | Status: DC | PRN

## 2023-08-09 MED ORDER — HYDROXYZINE HCL 25 MG PO TABS
25.0000 mg | ORAL_TABLET | Freq: Three times a day (TID) | ORAL | Status: DC | PRN
Start: 2023-08-09 — End: 2023-08-10
  Administered 2023-08-09 (×2): 25 mg via ORAL
  Filled 2023-08-09 (×2): qty 1

## 2023-08-09 MED ORDER — ESCITALOPRAM OXALATE 10 MG PO TABS
10.0000 mg | ORAL_TABLET | Freq: Every day | ORAL | Status: DC
  Administered 2023-08-09: 10 mg via ORAL
  Filled 2023-08-09 (×2): qty 1

## 2023-08-09 MED ORDER — GABAPENTIN 300 MG PO CAPS
300.0000 mg | ORAL_CAPSULE | Freq: Three times a day (TID) | ORAL | Status: DC
  Administered 2023-08-09 – 2023-08-10 (×3): 300 mg via ORAL
  Filled 2023-08-09 (×4): qty 1

## 2023-08-09 NOTE — ED Notes (Signed)
 Sheriffs dept was called   message left on voicemail about transport for Monday morning am  to [facility]
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
## 2023-08-09 NOTE — ED Notes (Signed)
 Pt is kicking linen cabinet in hallway.   Security went to correct his behavior.

## 2023-08-09 NOTE — ED Notes (Signed)
 Pt woke up making loud noises , RN obtained vitals, pt now wondering around unit

## 2023-08-09 NOTE — ED Notes (Signed)
 Pt resting in bed.

## 2023-08-09 NOTE — ED Notes (Signed)
 Pt stripped underwear.  Security threw underwear in trash.
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
## 2023-08-09 NOTE — ED Notes (Signed)
 Patient has asked about using the phone again    he was advised that he has had three calls already   he said the phone number says disconnected  he is advised that no matter how many attempts he makes the number would still be disconnected   he had no other numbers to try and call

## 2023-08-09 NOTE — Progress Notes (Signed)
 Patient has been denied by [facility] due to no appropriate beds available. Patient meets BH inpatient criteria per Dahlia Byes, NP. Patient has been faxed out to the following facilities:   [facility] [address] [place] [place] [address] [phone]  [facility] [address] [place] [address] [phone]  [facility] [address] [place] [address] [phone]  [facility] [address] [place], [place] [address] [phone]  [facility] Patient Placement [facility], [place] [phone]  [facility] [address] [place] [address] [phone]  [facility] [address] [place] [address] [phone]  [facility] [address] [facility] Regino Bellow [place] [address] [phone]  [facility] [address] [place] [address] [phone]  [facility] [address] [place], [place] [address] [phone]  [facility] [address] [place] [address] [phone]  [facility] [address], [place] [address] [phone]  [facility] [address] [place] [address] [phone]  [facility] [address] [place] [address] [phone]  [facility] [address], [place] [address] [phone]  [facility] [address] [place] [address] [phone]   Damita Dunnings, MSW, LCSW-A  1:12 PM 08/09/2023

## 2023-08-09 NOTE — ED Notes (Signed)
 Pt has been ACCEPTED to [facility] Unit for tomorrow   Accepting MD is Dr. Sherrian Divers   TS Reporting number is [phone]

## 2023-08-09 NOTE — ED Notes (Signed)
 3rd time using the phone today.  Informed pt that this was his last phone call of the day.

## 2023-08-09 NOTE — ED Notes (Signed)
 Patient ripped scubs    he was given a new pair   he is upset because the phone number for his daughter is out of service

## 2023-08-09 NOTE — ED Notes (Signed)
 IVC case # [id number]

## 2023-08-09 NOTE — Progress Notes (Signed)
 [facility] LCSW Progress Note   08/09/2023    1:32 PM  Hoyle Barr   [id number]   Type of Contact and Topic:  Psychiatric Bed Placement   Pt accepted to [facility] Unit     Patient meets inpatient criteria per Fayette Pho, NP  The attending provider will be Dr. Sherrian Divers   Call report to [phone]  Carleene Overlie, Paramedic @ [facility] notified.     Pt scheduled  to arrive at [facility] for TOMORROW.    Damita Dunnings, MSW, LCSW-A  1:33 PM 08/09/2023

## 2023-08-09 NOTE — ED Notes (Signed)
 Patient states he doesn't take those medications but he took them anyway with no hesitation

## 2023-08-10 DIAGNOSIS — F17210 Nicotine dependence, cigarettes, uncomplicated: Secondary | ICD-10-CM | POA: Diagnosis not present

## 2023-08-10 DIAGNOSIS — R45851 Suicidal ideations: Secondary | ICD-10-CM | POA: Diagnosis not present

## 2023-08-10 DIAGNOSIS — F1420 Cocaine dependence, uncomplicated: Secondary | ICD-10-CM | POA: Diagnosis not present

## 2023-08-10 DIAGNOSIS — F1210 Cannabis abuse, uncomplicated: Secondary | ICD-10-CM | POA: Diagnosis not present

## 2023-08-10 DIAGNOSIS — F1320 Sedative, hypnotic or anxiolytic dependence, uncomplicated: Secondary | ICD-10-CM | POA: Diagnosis not present

## 2023-08-10 DIAGNOSIS — G629 Polyneuropathy, unspecified: Secondary | ICD-10-CM | POA: Diagnosis not present

## 2023-08-10 DIAGNOSIS — F1510 Other stimulant abuse, uncomplicated: Secondary | ICD-10-CM | POA: Diagnosis not present

## 2023-08-10 DIAGNOSIS — F1914 Other psychoactive substance abuse with psychoactive substance-induced mood disorder: Secondary | ICD-10-CM | POA: Diagnosis not present

## 2023-08-10 DIAGNOSIS — F339 Major depressive disorder, recurrent, unspecified: Secondary | ICD-10-CM | POA: Diagnosis not present

## 2023-08-10 DIAGNOSIS — F39 Unspecified mood [affective] disorder: Secondary | ICD-10-CM | POA: Diagnosis not present

## 2023-08-10 DIAGNOSIS — F4325 Adjustment disorder with mixed disturbance of emotions and conduct: Secondary | ICD-10-CM | POA: Diagnosis not present

## 2023-08-10 DIAGNOSIS — F1520 Other stimulant dependence, uncomplicated: Secondary | ICD-10-CM | POA: Diagnosis not present

## 2023-08-10 DIAGNOSIS — F430 Acute stress reaction: Secondary | ICD-10-CM | POA: Diagnosis not present

## 2023-08-10 DIAGNOSIS — F411 Generalized anxiety disorder: Secondary | ICD-10-CM | POA: Diagnosis not present

## 2023-08-10 DIAGNOSIS — G4700 Insomnia, unspecified: Secondary | ICD-10-CM | POA: Diagnosis not present

## 2023-08-10 DIAGNOSIS — F1410 Cocaine abuse, uncomplicated: Secondary | ICD-10-CM | POA: Diagnosis not present

## 2023-08-10 DIAGNOSIS — E871 Hypo-osmolality and hyponatremia: Secondary | ICD-10-CM | POA: Diagnosis not present

## 2023-08-10 DIAGNOSIS — F1920 Other psychoactive substance dependence, uncomplicated: Secondary | ICD-10-CM | POA: Diagnosis not present

## 2023-08-10 DIAGNOSIS — F1910 Other psychoactive substance abuse, uncomplicated: Secondary | ICD-10-CM | POA: Diagnosis not present

## 2023-08-10 DIAGNOSIS — F314 Bipolar disorder, current episode depressed, severe, without psychotic features: Secondary | ICD-10-CM | POA: Diagnosis not present

## 2023-08-10 NOTE — ED Notes (Signed)
 Patient has been alert.  Patient irritable and demanding. Patient redirected and reassured. Patient refused Lexapro this AM.

## 2023-08-10 NOTE — ED Notes (Signed)
 Pt just got off phone , pt sobbing upset stating he don't want go to [place].

## 2023-08-10 NOTE — ED Provider Notes (Signed)
 Emergency Medicine Observation Re-evaluation Note  Ryan Olsen is a 43 y.o. male, seen on rounds today.  Pt initially presented to the ED for complaints of substance use disorder and Stress Currently, the patient is sleeping.  Physical Exam  BP (!) 140/96   Pulse 88   Temp 97.7 F (36.5 C)   Resp 18   Ht 5\' 7"  (1.702 m)   Wt 59 kg   SpO2 100%   BMI 20.36 kg/m  Physical Exam General: nad Cardiac: regular Lungs: clear Psych: calm  ED Course / MDM  EKG:   I have reviewed the labs performed to date as well as medications administered while in observation.  Recent changes in the last 24 hours include none.  Plan  Current plan is for inpt psych.    Gwyneth Sprout, MD 08/10/23 1249

## 2023-08-10 NOTE — ED Notes (Signed)
 Gave daughter update on pt , pt feeling a little better currently in bed resting let me pt let me obtain vitals.

## 2023-08-10 NOTE — ED Notes (Signed)
 Patient discharged off unit to [facility] per provider. Patient alert, calm, cooperative, no s/s of distress at this time. Discharge and belongings given to [facility] for transport. Patient ambulatory. Patient escorted and transported by [facility].

## 2023-08-10 NOTE — ED Notes (Signed)
 Patient is resting comfortably.

## 2023-08-17 DIAGNOSIS — F430 Acute stress reaction: Secondary | ICD-10-CM | POA: Diagnosis not present

## 2023-08-25 ENCOUNTER — Encounter (HOSPITAL_COMMUNITY): Payer: Self-pay

## 2023-08-25 ENCOUNTER — Other Ambulatory Visit: Payer: Self-pay

## 2023-08-25 ENCOUNTER — Emergency Department (HOSPITAL_COMMUNITY)
Admission: EM | Admit: 2023-08-25 | Discharge: 2023-08-26 | Disposition: A | Discharge status: Other Acute Inpatient Hospital | Attending: Emergency Medicine | Admitting: Emergency Medicine

## 2023-08-25 DIAGNOSIS — R9431 Abnormal electrocardiogram [ECG] [EKG]: Secondary | ICD-10-CM | POA: Diagnosis not present

## 2023-08-25 DIAGNOSIS — F1910 Other psychoactive substance abuse, uncomplicated: Secondary | ICD-10-CM | POA: Insufficient documentation

## 2023-08-25 DIAGNOSIS — F1914 Other psychoactive substance abuse with psychoactive substance-induced mood disorder: Secondary | ICD-10-CM | POA: Diagnosis not present

## 2023-08-25 DIAGNOSIS — R45851 Suicidal ideations: Secondary | ICD-10-CM

## 2023-08-25 DIAGNOSIS — F1994 Other psychoactive substance use, unspecified with psychoactive substance-induced mood disorder: Secondary | ICD-10-CM

## 2023-08-25 LAB — CBC WITH DIFFERENTIAL/PLATELET
Abs Immature Granulocytes: 0.01 10*3/uL (ref 0.00–0.07)
Basophils Absolute: 0 10*3/uL (ref 0.0–0.1)
Basophils Relative: 0 %
Eosinophils Absolute: 0 10*3/uL (ref 0.0–0.5)
Eosinophils Relative: 1 %
HCT: 37.6 % — ABNORMAL LOW (ref 39.0–52.0)
Hemoglobin: 12.7 g/dL — ABNORMAL LOW (ref 13.0–17.0)
Immature Granulocytes: 0 %
Lymphocytes Relative: 16 %
Lymphs Abs: 0.7 10*3/uL (ref 0.7–4.0)
MCH: 31.1 pg (ref 26.0–34.0)
MCHC: 33.8 g/dL (ref 30.0–36.0)
MCV: 91.9 fL (ref 80.0–100.0)
Monocytes Absolute: 0.4 10*3/uL (ref 0.1–1.0)
Monocytes Relative: 8 %
Neutro Abs: 3.5 10*3/uL (ref 1.7–7.7)
Neutrophils Relative %: 75 %
Platelets: 371 10*3/uL (ref 150–400)
RBC: 4.09 MIL/uL — ABNORMAL LOW (ref 4.22–5.81)
RDW: 12.2 % (ref 11.5–15.5)
WBC: 4.7 10*3/uL (ref 4.0–10.5)
nRBC: 0 % (ref 0.0–0.2)

## 2023-08-25 LAB — COMPREHENSIVE METABOLIC PANEL WITH GFR
ALT: 30 U/L (ref 0–44)
AST: 29 U/L (ref 15–41)
Albumin: 3.1 g/dL — ABNORMAL LOW (ref 3.5–5.0)
Alkaline Phosphatase: 53 U/L (ref 38–126)
Anion gap: 11 (ref 5–15)
BUN: 13 mg/dL (ref 6–20)
CO2: 23 mmol/L (ref 22–32)
Calcium: 8.4 mg/dL — ABNORMAL LOW (ref 8.9–10.3)
Chloride: 104 mmol/L (ref 98–111)
Creatinine, Ser: 0.67 mg/dL (ref 0.61–1.24)
GFR, Estimated: >60 mL/min (ref >60)
Glucose, Bld: 90 mg/dL (ref 70–99)
Potassium: 3.8 mmol/L (ref 3.5–5.1)
Sodium: 138 mmol/L (ref 135–145)
Total Bilirubin: 0.5 mg/dL (ref 0.0–1.2)
Total Protein: 6 g/dL — ABNORMAL LOW (ref 6.5–8.1)

## 2023-08-25 LAB — RAPID URINE DRUG SCREEN, HOSP PERFORMED
Amphetamines: POSITIVE — AB
Barbiturates: NOT DETECTED
Benzodiazepines: NOT DETECTED
Cocaine: POSITIVE — AB
Opiates: NOT DETECTED
Tetrahydrocannabinol: NOT DETECTED

## 2023-08-25 LAB — ETHANOL: Alcohol, Ethyl (B): <10 mg/dL (ref <10)

## 2023-08-25 MED ORDER — DOXEPIN HCL 25 MG PO CAPS
50.0000 mg | ORAL_CAPSULE | Freq: Two times a day (BID) | ORAL | Status: DC
  Administered 2023-08-25: 50 mg via ORAL
  Filled 2023-08-25 (×2): qty 1
  Filled 2023-08-25 (×2): qty 2
  Filled 2023-08-25: qty 1

## 2023-08-25 MED ORDER — GABAPENTIN 300 MG PO CAPS
600.0000 mg | ORAL_CAPSULE | Freq: Three times a day (TID) | ORAL | Status: DC
  Administered 2023-08-25 – 2023-08-26 (×4): 600 mg via ORAL
  Filled 2023-08-25 (×4): qty 2

## 2023-08-25 MED ORDER — CLONAZEPAM 0.5 MG PO TABS
1.0000 mg | ORAL_TABLET | Freq: Four times a day (QID) | ORAL | Status: DC | PRN
  Administered 2023-08-25 – 2023-08-26 (×4): 1 mg via ORAL
  Filled 2023-08-25 (×4): qty 2

## 2023-08-25 NOTE — Progress Notes (Signed)
 LCSW Progress Note  [id number]   Ryan Olsen  08/25/2023  2:57 PM  Description:   Inpatient Psychiatric Referral  Patient was recommended inpatient per Eligha Bridegroom NP. There are no available beds at [facility], per [facility] Rona Ravens RN. Patient was referred to the following out of network facilities:   Destination  Service Provider Address Phone Fax  [facility] [address], [place] [address] [phone]  [facility] [address] [place], [place] [address] [phone]  [facility] [address] [place] [address] [phone]  [facility] [address] [place] [address] [phone]  [facility] [address], [place] [address] [phone]  [facility] [address] [place] [address] [phone]  [facility] EFAX [address] [place] [phone]  [facility] [address] [place] [address] [phone]  [facility] [address], [place] [address] [phone]      Situation ongoing, CSW to continue following and update chart as more information becomes available.     Ryan Olsen MSW, LCSW  08/25/2023 2:57 PM

## 2023-08-25 NOTE — ED Notes (Signed)
 Called Safe Transport for drop off at [facility].

## 2023-08-25 NOTE — ED Notes (Signed)
 Graham crackers and soda provided.  Tray ordered

## 2023-08-25 NOTE — ED Notes (Signed)
 Left message on Sgt Carlean Purl to call [phone] regarding transport to [facility].

## 2023-08-25 NOTE — Progress Notes (Signed)
 Pt has been accepted to [facility] on 08/25/2023 Bed assignment: DELTA UNIT   Pt meets inpatient criteria per: Eligha Bridegroom NP  Attending Physician will be: Joylene Igo MD   Report can be called to:[phone]  Pt can arrive ASAP   Care Team Notified: Eligha Bridegroom NP, Penni Bombard Sheets RN  [place] Webb Weed LCSW-A   08/25/2023 3:24 PM

## 2023-08-25 NOTE — ED Notes (Signed)
 Pt refuses to sign voluntary consent forms to go [facility]. Pt states he has set up [facility] & will go there tomorrow. Psychiatry notified.

## 2023-08-25 NOTE — ED Notes (Signed)
 Spoke with Sheriff's office, stated they are unable to transport patient to [facility] due to patient not being IVCd, Have asked secretary to reach out to safe transport to see if he can be transported with them.

## 2023-08-25 NOTE — ED Provider Notes (Signed)
 [place] EMERGENCY DEPARTMENT AT [facility] Provider Note   CSN: [id number] Arrival date & time: 08/25/23  0439     History PTSD Chief Complaint  Patient presents with   Cold Exposure    Ryan Olsen is a 43 y.o. male.  43 year old male with a past medical history of PTSD, alcohol abuse, drug abuse presents to the ED EMS with a chief complaint of cold exposure, generalized bodyaches and auditory hallucinations.  According to patient he has been living in a hotel, however has been staying outside without home for the past couple of days, he states that he has not taken any of his mental health medications in approximately 4 days.  He is having auditory hallucinations, where he hears his dad calling him.  He also endorses suicidal ideations, with having multiple plans on how to take his life.  He was previously seen here on March 22 for similar complaint.  He denies any chest pain, shortness of breath, fevers.  The history is provided by the patient.       Home Medications Prior to Admission medications   Medication Sig Start Date End Date Taking? Authorizing Provider  amphetamine-dextroamphetamine (ADDERALL) 20 MG tablet Take 20 mg by mouth 3 (three) times daily. Patient not taking: Reported on 08/08/2023 06/30/23   [provider]  clonazePAM (KLONOPIN) 1 MG tablet Take 1 mg by mouth 4 (four) times daily as needed for anxiety. Patient not taking: Reported on 08/08/2023    [provider]  doxepin (SINEQUAN) 50 MG capsule Take 50 mg by mouth 2 (two) times daily. Patient not taking: Reported on 08/08/2023 07/23/23   [provider]  gabapentin (NEURONTIN) 600 MG tablet Take 1 tablet (600 mg total) by mouth in the morning, at noon, in the evening, and at bedtime. Patient not taking: Reported on 08/08/2023 12/17/21   Nelwyn Salisbury, MD  SYRINGE-NEEDLE, DISP, 3 ML (LUER LOCK SAFETY SYRINGES) 22G X 1-1/2" 3 ML MISC 1 Application by Does not apply route  every 14 (fourteen) days. Patient not taking: Reported on 08/08/2023 12/27/21   Nelwyn Salisbury, MD  testosterone cypionate (DEPOTESTOSTERONE CYPIONATE) 200 MG/ML injection INJECT 1 ML (200 MG TOTAL) INTO THE MUSCLE EVERY 14 (FOURTEEN) DAYS. Patient not taking: Reported on 08/08/2023 04/09/23   Nelwyn Salisbury, MD      Allergies    Patient has no known allergies.    Review of Systems   Review of Systems  Constitutional:  Negative for chills and fever.  Respiratory:  Negative for shortness of breath.   Gastrointestinal:  Negative for abdominal pain, nausea and vomiting.  Musculoskeletal:  Positive for myalgias.  Skin:  Negative for wound.  Psychiatric/Behavioral:  Positive for suicidal ideas. The patient is nervous/anxious.   All other systems reviewed and are negative.   Physical Exam Updated Vital Signs BP (!) 130/92   Pulse 71   Temp 97.9 F (36.6 C)   Resp 16   SpO2 100%  Physical Exam Vitals and nursing note reviewed.  Constitutional:      Comments: Disheveled.  HENT:     Head: Normocephalic and atraumatic.     Mouth/Throat:     Mouth: Mucous membranes are moist.  Cardiovascular:     Rate and Rhythm: Normal rate.  Pulmonary:     Effort: Pulmonary effort is normal.  Abdominal:     General: Abdomen is flat.  Musculoskeletal:     Cervical back: Normal range of motion and neck supple.  Skin:    General: Skin is warm and dry.  Neurological:     Mental Status: He is alert and oriented to person, place, and time.  Psychiatric:        Attention and Perception: Attention normal.        Mood and Affect: Affect is flat.        Speech: Speech is delayed.        Behavior: Behavior is cooperative.        Thought Content: Thought content includes suicidal ideation. Thought content includes suicidal plan.     ED Results / Procedures / Treatments   Labs (all labs ordered are listed, but only abnormal results are displayed) Labs Reviewed  COMPREHENSIVE METABOLIC PANEL WITH  GFR - Abnormal; Notable for the following components:      Result Value   Calcium 8.4 (*)    Total Protein 6.0 (*)    Albumin 3.1 (*)    All other components within normal limits  RAPID URINE DRUG SCREEN, HOSP PERFORMED - Abnormal; Notable for the following components:   Cocaine POSITIVE (*)    Amphetamines POSITIVE (*)    All other components within normal limits  CBC WITH DIFFERENTIAL/PLATELET - Abnormal; Notable for the following components:   RBC 4.09 (*)    Hemoglobin 12.7 (*)    HCT 37.6 (*)    All other components within normal limits  ETHANOL    EKG EKG Interpretation Date/Time:  Tuesday August 25 2023 09:42:42 EDT Ventricular Rate:  66 PR Interval:  160 QRS Duration:  110 QT Interval:  410 QTC Calculation: 429 R Axis:   81  Text Interpretation: Normal sinus rhythm  no acute ST/T changes Confirmed by Pricilla Loveless [phone]) on 08/25/2023 11:05:59 AM  Radiology No results found.  Procedures Procedures    Medications Ordered in ED Medications  clonazePAM (KLONOPIN) tablet 1 mg (has no administration in time range)  doxepin (SINEQUAN) capsule 50 mg (50 mg Oral Not Given 08/25/23 1407)  gabapentin (NEURONTIN) capsule 600 mg (600 mg Oral Given 08/25/23 1407)    ED Course/ Medical Decision Making/ A&P Clinical Course as of 08/25/23 1419  Tue Aug 25, 2023  1107 COCAINE(!): POSITIVE [JS]  1107 Amphetamines(!): POSITIVE [JS]    Clinical Course User Index [JS] Claude Manges, PA-C                                 Medical Decision Making Amount and/or Complexity of Data Reviewed Labs: ordered. Decision-making details documented in ED Course.   Patient arrives to the ED via EMS with a chief complaint of generalized bodyaches, patient recently seen on August 08, 2023 due to alcohol use, along with substance abuse.  Patient reports he does not feel safe "currently living in the streets ", he states that he has been out of his mental health medication for approximately 4  days, necessary to experience some auditory hallucinations, hearing his dad to voice.  He is also endorsing suicidal ideation stating "I have a plan, actually multiple plans ".  He is not endorsing any chest pain, shortness of breath, fevers.  On primary evaluation, patient is in no distress laying in hallway bed answering questions appropriately although with a flat affect.  Blood work was obtained for a medical clearance, CBC is unremarkable, hemoglobin slightly decreased.  CMP without any electrolyte derangement, creatinine levels unremarkable.  LFTs are within normal limits.  UDS is positive  for cocaine, positive for amphetamines, no alcohol on board.  He is normotensive, without any tachycardia or tachypnea.  He is medically clear in order to obtain further workup by TTS.  2:19 PM patient is currently under no distress, eating his meal on stretcher bed, I did discuss with him being medically cleared and will need TTS consultation order to obtain psychiatric clearance.  If he opts to leave I do not feel that he is a harm to himself at this time.   Portions of this note were generated with Scientist, clinical (histocompatibility and immunogenetics). Dictation errors may occur despite best attempts at proofreading.   Final Clinical Impression(s) / ED Diagnoses Final diagnoses:  Suicidal ideation    Rx / DC Orders ED Discharge Orders     None         Claude Manges, PA-C 08/25/23 1419    Pricilla Loveless, MD 08/27/23 [phone]

## 2023-08-25 NOTE — ED Triage Notes (Signed)
 Pt BIB [facility] after he woke up outside in the rain and was cold and had generalized body aches.

## 2023-08-25 NOTE — Consult Note (Signed)
 [facility] Psychiatric Consult Initial  Patient Name: .Ryan Ryan  MRN: [id number]  DOB: 1980/06/25  Consult Order details:  Orders (From admission, onward)     Start     Ordered   08/25/23 1108  CONSULT TO CALL ACT TEAM       Ordering Provider: Claude Manges, PA-C  Provider:  (Not yet assigned)  Question:  Reason for Consult?  Answer:  SI   08/25/23 1107             Mode of Visit: In person    Psychiatry Consult Evaluation  Service Date: August 25, 2023 LOS:  LOS: 0 days  Chief Complaint suicidal ideations  Primary Psychiatric Diagnoses  Substance induced mood disorder 2.  Polysubstance abuse 3.    Assessment  Ryan Ryan is a 43 y.o. male admitted: Presented to the EDfor 08/25/2023  4:39 AM for suicidal ideations. He carries the psychiatric diagnoses of MDD, substance abuse.   His current presentation of depression, SI is most consistent with substance induced mood disorder. He meets criteria for IP based on active SI.  Current outpatient psychotropic medications include Doxepin, Klonopin, and gabapentin and historically he has had a positive response to these medications. He was  compliant with medications prior to admission as evidenced by patient report. On initial examination, patient reports depressed mood and active suicidal ideations. Please see plan below for detailed recommendations.   Diagnoses:  Active Hospital problems: Principal Problem:   Substance induced mood disorder (HCC) Active Problems:   Suicidal ideation    Plan   ## Psychiatric Medication Recommendations:  Continue home medications with some changes:  - Hold off Adderall - Restart Klonopin 1 mg QID PRN for anxiety - Restart Gabapentin 600 mg TID - Restart Doxepin 50 mg BID   ## Medical Decision Making Capacity: Not specifically addressed in this encounter  ## Further Work-up:  EKG pending   ## Disposition:-- We recommend inpatient psychiatric hospitalization when medically  cleared. Patient is under voluntary admission status at this time; please IVC if attempts to leave hospital.  ## Behavioral / Environmental: - No specific recommendations at this time.     ## Safety and Observation Level:  - Based on my clinical evaluation, I estimate the patient to be at low risk of self harm in the current setting. - At this time, we recommend  routine. This decision is based on my review of the chart including patient's history and current presentation, interview of the patient, mental status examination, and consideration of suicide risk including evaluating suicidal ideation, plan, intent, suicidal or self-harm behaviors, risk factors, and protective factors. This judgment is based on our ability to directly address suicide risk, implement suicide prevention strategies, and develop a safety plan while the patient is in the clinical setting. Please contact our team if there is a concern that risk level has changed.  CSSR Risk Category:C-SSRS RISK CATEGORY: High Risk  Suicide Risk Assessment: Patient has following modifiable risk factors for suicide: under treated depression , social isolation, and medication noncompliance, which we are addressing by inpatient admission. Patient has following non-modifiable or demographic risk factors for suicide: male gender Patient has the following protective factors against suicide: Access to outpatient mental health care and Cultural, spiritual, or religious beliefs that discourage suicide  Thank you for this consult request. Recommendations have been communicated to the primary team.  We will recommend inpatient treatment at this time.   Eligha Bridegroom, NP  History of Present Illness  Relevant Aspects of Hospital ED Course:  43 year old male with a past medical history of PTSD, alcohol abuse, drug abuse presents to the ED EMS with a chief complaint of cold exposure, generalized bodyaches and auditory hallucinations.  According  to patient he has been living in a hotel, however has been staying outside without home for the past couple of days, he states that he has not taken any of his mental health medications in approximately 4 days.  He is having auditory hallucinations, where he hears his dad calling him.  He also endorses suicidal ideations, with having multiple plans on how to take his life.  He was previously seen here on March 22 for similar complaint.  He denies any chest pain, shortness of breath, fevers.    Patient Report:  Pt seen at [facility] for face to face psychiatric evaluation. Pt did confirm information above, and reports active suicidal ideations. Pt states he has thought of many different plans, but does not have one in particular in mind at the moment. He reports many current stressors such as his father passing away a few weeks ago which has "torn his family apart", he does not speak to his brothers anymore, financial stress, homelessness. He also ahs been out of his psychotropic medications for around 4 days now. He reports numerous symptoms of depression such as hopelessness, low self esteem, depressed mood, anhedonia, impaired appetite and sleep. He also mentions substance abuse including THC and cocaine. Denies alcohol use. He denies HI. Denies AVH.   Pt does have outpatient psychiatrist, but denies current therapy services. He reports struggling with depression for most of his life. He reports chronic suicidal ideations and states "normally I can just ignore it but with everything going on it's hard to resist these thoughts. I would probably try and kill myself If I were to leave the hospital today." Pt is agreeable with inpatient treatment at this time.    Review of Systems  Psychiatric/Behavioral:  Positive for depression, substance abuse and suicidal ideas.   All other systems reviewed and are negative.    Psychiatric and Social History  Psychiatric History:  Information collected from patient,  chart  Prev Dx/Sx: MDD, substance abuse Current Psych Provider: yes Home Meds (current): doxepin, klonopin, gabapentin Previous Med Trials: "every antidepressant you can think of" Therapy: denies current  Prior Psych Hospitalization: yes  Prior Self Harm: yes   Social History:  Developmental Hx: wdl Occupational Hx: unemployed Armed forces operational officer Hx: denies Living Situation: homeless Spiritual Hx: yes Access to weapons/lethal means: denies   Substance History Alcohol: occasional   Tobacco: yes Illicit drugs: cocaine, THC Prescription drug abuse: suspected abuse   Exam Findings  Physical Exam:  Vital Signs:  Temp:  [97.9 F (36.6 C)-98 F (36.7 C)] 97.9 F (36.6 C) (04/08 1105) Pulse Rate:  [71-78] 71 (04/08 1105) Resp:  [15-16] 16 (04/08 1105) BP: (127-135)/(90-98) 130/92 (04/08 1105) SpO2:  [98 %-100 %] 100 % (04/08 1105) Blood pressure (!) 130/92, pulse 71, temperature 97.9 F (36.6 C), resp. rate 16, SpO2 100%. There is no height or weight on file to calculate BMI.  Physical Exam Vitals and nursing note reviewed.  Neurological:     Mental Status: He is alert and oriented to person, place, and time.     Mental Status Exam: General Appearance: Fairly Groomed  Orientation:  Full (Time, Place, and Person)  Memory:  Immediate;   Good Recent;   Good  Concentration:  Concentration: Good  Recall:  Good  Attention  Good  Eye Contact:  Good  Speech:  Clear and Coherent  Language:  Good  Volume:  Normal  Mood: "depressed"  Affect:  Congruent  Thought Process:  Coherent and Goal Directed  Thought Content:  WDL  Suicidal Thoughts:  Yes.  without intent/plan  Homicidal Thoughts:  No  Judgement:  Fair  Insight:  Fair  Psychomotor Activity:  Normal  Akathisia:  No  Fund of Knowledge:  Fair      Assets:  Communication Skills Desire for Improvement Physical Health Resilience Social Support  Cognition:  WNL  ADL's:  Intact  AIMS (if indicated):        Other  History   These have been pulled in through the EMR, reviewed, and updated if appropriate.  Family History:  The patient's family history includes Cancer - Prostate in his father and paternal grandfather; Depression in his father; Prostate cancer in his father and paternal grandfather.  Medical History: Past Medical History:  Diagnosis Date  . Depression   . GERD (gastroesophageal reflux disease)   . Hiatal hernia     Surgical History: History reviewed. No pertinent surgical history.   Medications:   Current Facility-Administered Medications:  .  clonazePAM (KLONOPIN) tablet 1 mg, 1 mg, Oral, QID PRN, Eligha Bridegroom, NP .  doxepin (SINEQUAN) capsule 50 mg, 50 mg, Oral, BID, Eligha Bridegroom, NP .  gabapentin (NEURONTIN) tablet 600 mg, 600 mg, Oral, TID, Eligha Bridegroom, NP  Current Outpatient Medications:  .  amphetamine-dextroamphetamine (ADDERALL) 20 MG tablet, Take 20 mg by mouth 3 (three) times daily. (Patient not taking: Reported on 08/08/2023), Disp: , Rfl:  .  clonazePAM (KLONOPIN) 1 MG tablet, Take 1 mg by mouth 4 (four) times daily as needed for anxiety. (Patient not taking: Reported on 08/08/2023), Disp: , Rfl:  .  doxepin (SINEQUAN) 50 MG capsule, Take 50 mg by mouth 2 (two) times daily. (Patient not taking: Reported on 08/08/2023), Disp: , Rfl:  .  gabapentin (NEURONTIN) 600 MG tablet, Take 1 tablet (600 mg total) by mouth in the morning, at noon, in the evening, and at bedtime. (Patient not taking: Reported on 08/08/2023), Disp: 4 tablet, Rfl: 0 .  SYRINGE-NEEDLE, DISP, 3 ML (LUER LOCK SAFETY SYRINGES) 22G X 1-1/2" 3 ML MISC, 1 Application by Does not apply route every 14 (fourteen) days. (Patient not taking: Reported on 08/08/2023), Disp: 50 each, Rfl: 5 .  testosterone cypionate (DEPOTESTOSTERONE CYPIONATE) 200 MG/ML injection, INJECT 1 ML (200 MG TOTAL) INTO THE MUSCLE EVERY 14 (FOURTEEN) DAYS. (Patient not taking: Reported on 08/08/2023), Disp: 10 mL, Rfl:  5  Allergies: No Known Allergies  Eligha Bridegroom, NP

## 2023-08-25 NOTE — ED Notes (Signed)
 Spoke to Ryan Olsen with safe transport, patient will be able to be transported tomorrow morning between 0700-0800

## 2023-08-26 DIAGNOSIS — F17290 Nicotine dependence, other tobacco product, uncomplicated: Secondary | ICD-10-CM | POA: Diagnosis not present

## 2023-08-26 DIAGNOSIS — F319 Bipolar disorder, unspecified: Secondary | ICD-10-CM | POA: Diagnosis not present

## 2023-08-26 DIAGNOSIS — F1190 Opioid use, unspecified, uncomplicated: Secondary | ICD-10-CM | POA: Diagnosis not present

## 2023-08-26 DIAGNOSIS — F1110 Opioid abuse, uncomplicated: Secondary | ICD-10-CM | POA: Diagnosis not present

## 2023-08-26 DIAGNOSIS — F1490 Cocaine use, unspecified, uncomplicated: Secondary | ICD-10-CM | POA: Diagnosis not present

## 2023-08-26 DIAGNOSIS — Z560 Unemployment, unspecified: Secondary | ICD-10-CM | POA: Diagnosis not present

## 2023-08-26 DIAGNOSIS — Z5900 Homelessness unspecified: Secondary | ICD-10-CM | POA: Diagnosis not present

## 2023-08-26 DIAGNOSIS — F1914 Other psychoactive substance abuse with psychoactive substance-induced mood disorder: Secondary | ICD-10-CM | POA: Diagnosis not present

## 2023-08-26 DIAGNOSIS — F1910 Other psychoactive substance abuse, uncomplicated: Secondary | ICD-10-CM | POA: Diagnosis not present

## 2023-08-26 DIAGNOSIS — F909 Attention-deficit hyperactivity disorder, unspecified type: Secondary | ICD-10-CM | POA: Diagnosis not present

## 2023-08-26 DIAGNOSIS — Z650 Conviction in civil and criminal proceedings without imprisonment: Secondary | ICD-10-CM | POA: Diagnosis not present

## 2023-08-26 DIAGNOSIS — F411 Generalized anxiety disorder: Secondary | ICD-10-CM | POA: Diagnosis not present

## 2023-08-26 DIAGNOSIS — F429 Obsessive-compulsive disorder, unspecified: Secondary | ICD-10-CM | POA: Diagnosis not present

## 2023-08-26 MED ORDER — AMPHETAMINE-DEXTROAMPHETAMINE 10 MG PO TABS
20.0000 mg | ORAL_TABLET | Freq: Three times a day (TID) | ORAL | Status: DC
  Administered 2023-08-26 (×3): 20 mg via ORAL
  Filled 2023-08-26 (×3): qty 2

## 2023-08-26 NOTE — ED Notes (Signed)
 [facility] scheduled for patient be transported to [facility].  Per dispatch, no ETA can be provided, but was informed that they could call the nurse when they are close.  [facility] Transport was given the [facility] Rn's telephone number. Information also given to blue zone Diplomatic Services operational officer.

## 2023-08-26 NOTE — ED Notes (Signed)
 Print production planner for drop off at [employer])

## 2023-08-26 NOTE — ED Notes (Signed)
Patient taking a shower at this time.  

## 2023-08-26 NOTE — ED Notes (Signed)
 IVC paperwork received from Dr. Criss Alvine and filled out by Lucy Antigua, NS.  IVC documents placed in green zone ready for submission.

## 2023-08-26 NOTE — ED Notes (Signed)
 [facility] RN gave instructions to hold off on sumbitting IVC documents. RN states that the patient is willing to be admitted voluntarily.  IVC documents are located in the green zone on the purple clip board at the [employer].

## 2023-08-26 NOTE — ED Provider Notes (Signed)
 Emergency Medicine Observation Re-evaluation Note  Ryan Olsen is a 43 y.o. male, seen on rounds today.  Pt initially presented to the ED for complaints of Cold Exposure Currently, the patient is resting, talking on the phone.  Physical Exam  BP (!) 138/90   Pulse 71   Temp (!) 97.4 F (36.3 C) (Oral)   Resp 16   Ht 5\' 7"  (1.702 m)   Wt 59 kg   SpO2 99%   BMI 20.36 kg/m  Physical Exam General: no distress Lungs: normal effort Psych: no agitation  ED Course / MDM  EKG:EKG Interpretation Date/Time:  Tuesday August 25 2023 09:42:42 EDT Ventricular Rate:  66 PR Interval:  160 QRS Duration:  110 QT Interval:  410 QTC Calculation: 429 R Axis:   81  Text Interpretation: Normal sinus rhythm  no acute ST/T changes Confirmed by Pricilla Loveless [phone]) on 08/25/2023 11:05:59 AM  I have reviewed the labs performed to date as well as medications administered while in observation.  Recent changes in the last 24 hours include home meds.  Plan  Current plan is for transfer to [facility]. Patient is going voluntarily.    Pricilla Loveless, MD 08/26/23 [phone]

## 2023-08-26 NOTE — ED Notes (Signed)
 Patient spoke with sister in law Baxter Hire is now agreeable to go to [facility].

## 2023-08-26 NOTE — ED Notes (Signed)
 Patient being aggressive yelling demanding things.  Was reported off that he got 3 sandwiches last night and demanding more food.  Told him he had to wait for breakfast. Patient is voluntary and wanting to leave.  Patient refused admissiion to [facility] and wants to go to [facility].

## 2023-08-26 NOTE — ED Notes (Signed)
 Patient angry because dinner had not come. Advised patient he will get food when he gets to his destination.  Patient REFUSED TO TAKE ANY OF HIS BELONGINGS so placed back in purple locker room. Patient escorted out to safe transport.

## 2023-08-26 NOTE — ED Notes (Signed)
 Drafted IVC docs delivered to RN Megan in purple zone in case it is necessary to facilitate treatment; patient remains VOLUNTARY at this time.

## 2023-08-26 NOTE — ED Notes (Signed)
 [facility] to let them know patient has left.

## 2023-08-27 ENCOUNTER — Telehealth: Payer: Self-pay

## 2023-08-27 DIAGNOSIS — F1110 Opioid abuse, uncomplicated: Secondary | ICD-10-CM | POA: Diagnosis not present

## 2023-08-27 DIAGNOSIS — F319 Bipolar disorder, unspecified: Secondary | ICD-10-CM | POA: Diagnosis not present

## 2023-08-27 DIAGNOSIS — F429 Obsessive-compulsive disorder, unspecified: Secondary | ICD-10-CM | POA: Diagnosis not present

## 2023-08-27 DIAGNOSIS — F411 Generalized anxiety disorder: Secondary | ICD-10-CM | POA: Diagnosis not present

## 2023-08-27 NOTE — Transitions of Care (Post Inpatient/ED Visit) (Unsigned)
   08/27/2023  Name: Ryan Olsen MRN: [id number] DOB: [birth year]  Today's TOC FU Call Status: Today's TOC FU Call Status:: Unsuccessful Call (1st Attempt) Unsuccessful Call (1st Attempt) Date: 08/27/23  Attempted to reach the patient regarding the most recent Inpatient/ED visit.  Follow Up Plan: Additional outreach attempts will be made to reach the patient to complete the Transitions of Care (Post Inpatient/ED visit) call.   Signature Agnes Lawrence, CMA (AAMA)  [facility] [phone]

## 2023-08-28 DIAGNOSIS — F1110 Opioid abuse, uncomplicated: Secondary | ICD-10-CM | POA: Diagnosis not present

## 2023-08-28 DIAGNOSIS — F429 Obsessive-compulsive disorder, unspecified: Secondary | ICD-10-CM | POA: Diagnosis not present

## 2023-08-28 DIAGNOSIS — F411 Generalized anxiety disorder: Secondary | ICD-10-CM | POA: Diagnosis not present

## 2023-08-28 DIAGNOSIS — F319 Bipolar disorder, unspecified: Secondary | ICD-10-CM | POA: Diagnosis not present

## 2023-08-28 NOTE — Transitions of Care (Post Inpatient/ED Visit) (Signed)
   08/28/2023  Name: Ryan Olsen MRN: [id number] DOB: 04-Dec-1980  Today's TOC FU Call Status: Today's TOC FU Call Status:: Unsuccessful Call (2nd Attempt) Unsuccessful Call (1st Attempt) Date: 08/27/23 Unsuccessful Call (2nd Attempt) Date: 08/28/23  Attempted to reach the patient regarding the most recent Inpatient/ED visit.  Follow Up Plan: No further outreach attempts will be made at this time. We have been unable to contact the patient.  Signature Agnes Lawrence, CMA (AAMA)  [facility] [phone]

## 2023-08-29 DIAGNOSIS — F429 Obsessive-compulsive disorder, unspecified: Secondary | ICD-10-CM | POA: Diagnosis not present

## 2023-08-29 DIAGNOSIS — F1110 Opioid abuse, uncomplicated: Secondary | ICD-10-CM | POA: Diagnosis not present

## 2023-08-29 DIAGNOSIS — F411 Generalized anxiety disorder: Secondary | ICD-10-CM | POA: Diagnosis not present

## 2023-08-30 DIAGNOSIS — F429 Obsessive-compulsive disorder, unspecified: Secondary | ICD-10-CM | POA: Diagnosis not present

## 2023-08-30 DIAGNOSIS — F411 Generalized anxiety disorder: Secondary | ICD-10-CM | POA: Diagnosis not present

## 2023-08-30 DIAGNOSIS — F1110 Opioid abuse, uncomplicated: Secondary | ICD-10-CM | POA: Diagnosis not present

## 2023-08-31 DIAGNOSIS — F411 Generalized anxiety disorder: Secondary | ICD-10-CM | POA: Diagnosis not present

## 2023-08-31 DIAGNOSIS — F1110 Opioid abuse, uncomplicated: Secondary | ICD-10-CM | POA: Diagnosis not present

## 2023-08-31 DIAGNOSIS — F319 Bipolar disorder, unspecified: Secondary | ICD-10-CM | POA: Diagnosis not present

## 2023-08-31 DIAGNOSIS — F429 Obsessive-compulsive disorder, unspecified: Secondary | ICD-10-CM | POA: Diagnosis not present

## 2023-09-01 DIAGNOSIS — F429 Obsessive-compulsive disorder, unspecified: Secondary | ICD-10-CM | POA: Diagnosis not present

## 2023-09-01 DIAGNOSIS — F1110 Opioid abuse, uncomplicated: Secondary | ICD-10-CM | POA: Diagnosis not present

## 2023-09-01 DIAGNOSIS — F411 Generalized anxiety disorder: Secondary | ICD-10-CM | POA: Diagnosis not present

## 2023-09-02 DIAGNOSIS — F1320 Sedative, hypnotic or anxiolytic dependence, uncomplicated: Secondary | ICD-10-CM | POA: Diagnosis not present

## 2023-09-02 DIAGNOSIS — F1420 Cocaine dependence, uncomplicated: Secondary | ICD-10-CM | POA: Diagnosis not present

## 2023-09-02 DIAGNOSIS — F1120 Opioid dependence, uncomplicated: Secondary | ICD-10-CM | POA: Diagnosis not present

## 2023-09-03 DIAGNOSIS — F1320 Sedative, hypnotic or anxiolytic dependence, uncomplicated: Secondary | ICD-10-CM | POA: Diagnosis not present

## 2023-09-04 DIAGNOSIS — F1120 Opioid dependence, uncomplicated: Secondary | ICD-10-CM | POA: Diagnosis not present

## 2023-09-04 DIAGNOSIS — F1320 Sedative, hypnotic or anxiolytic dependence, uncomplicated: Secondary | ICD-10-CM | POA: Diagnosis not present

## 2023-09-06 DIAGNOSIS — F1320 Sedative, hypnotic or anxiolytic dependence, uncomplicated: Secondary | ICD-10-CM | POA: Diagnosis not present

## 2023-09-07 DIAGNOSIS — F1320 Sedative, hypnotic or anxiolytic dependence, uncomplicated: Secondary | ICD-10-CM | POA: Diagnosis not present

## 2023-09-08 DIAGNOSIS — F1320 Sedative, hypnotic or anxiolytic dependence, uncomplicated: Secondary | ICD-10-CM | POA: Diagnosis not present

## 2023-09-09 DIAGNOSIS — F1120 Opioid dependence, uncomplicated: Secondary | ICD-10-CM | POA: Diagnosis not present

## 2023-09-09 DIAGNOSIS — F1320 Sedative, hypnotic or anxiolytic dependence, uncomplicated: Secondary | ICD-10-CM | POA: Diagnosis not present

## 2023-09-10 DIAGNOSIS — F1320 Sedative, hypnotic or anxiolytic dependence, uncomplicated: Secondary | ICD-10-CM | POA: Diagnosis not present

## 2023-09-10 DIAGNOSIS — F1120 Opioid dependence, uncomplicated: Secondary | ICD-10-CM | POA: Diagnosis not present

## 2023-09-11 DIAGNOSIS — F1120 Opioid dependence, uncomplicated: Secondary | ICD-10-CM | POA: Diagnosis not present

## 2023-09-11 DIAGNOSIS — F1320 Sedative, hypnotic or anxiolytic dependence, uncomplicated: Secondary | ICD-10-CM | POA: Diagnosis not present

## 2023-09-13 DIAGNOSIS — F1320 Sedative, hypnotic or anxiolytic dependence, uncomplicated: Secondary | ICD-10-CM | POA: Diagnosis not present

## 2023-09-14 DIAGNOSIS — F1120 Opioid dependence, uncomplicated: Secondary | ICD-10-CM | POA: Diagnosis not present

## 2023-09-14 DIAGNOSIS — F1320 Sedative, hypnotic or anxiolytic dependence, uncomplicated: Secondary | ICD-10-CM | POA: Diagnosis not present

## 2023-09-15 DIAGNOSIS — F1120 Opioid dependence, uncomplicated: Secondary | ICD-10-CM | POA: Diagnosis not present

## 2023-09-15 DIAGNOSIS — F1320 Sedative, hypnotic or anxiolytic dependence, uncomplicated: Secondary | ICD-10-CM | POA: Diagnosis not present

## 2023-09-16 DIAGNOSIS — F1120 Opioid dependence, uncomplicated: Secondary | ICD-10-CM | POA: Diagnosis not present

## 2023-09-16 DIAGNOSIS — F1320 Sedative, hypnotic or anxiolytic dependence, uncomplicated: Secondary | ICD-10-CM | POA: Diagnosis not present

## 2023-09-17 DIAGNOSIS — F1320 Sedative, hypnotic or anxiolytic dependence, uncomplicated: Secondary | ICD-10-CM | POA: Diagnosis not present

## 2023-09-17 DIAGNOSIS — F1120 Opioid dependence, uncomplicated: Secondary | ICD-10-CM | POA: Diagnosis not present

## 2023-09-18 DIAGNOSIS — F1320 Sedative, hypnotic or anxiolytic dependence, uncomplicated: Secondary | ICD-10-CM | POA: Diagnosis not present

## 2023-09-18 DIAGNOSIS — F1120 Opioid dependence, uncomplicated: Secondary | ICD-10-CM | POA: Diagnosis not present

## 2023-09-20 DIAGNOSIS — F1120 Opioid dependence, uncomplicated: Secondary | ICD-10-CM | POA: Diagnosis not present

## 2023-09-20 DIAGNOSIS — F1320 Sedative, hypnotic or anxiolytic dependence, uncomplicated: Secondary | ICD-10-CM | POA: Diagnosis not present

## 2023-09-21 DIAGNOSIS — F1320 Sedative, hypnotic or anxiolytic dependence, uncomplicated: Secondary | ICD-10-CM | POA: Diagnosis not present

## 2023-09-21 DIAGNOSIS — F1120 Opioid dependence, uncomplicated: Secondary | ICD-10-CM | POA: Diagnosis not present

## 2023-09-22 DIAGNOSIS — F1320 Sedative, hypnotic or anxiolytic dependence, uncomplicated: Secondary | ICD-10-CM | POA: Diagnosis not present

## 2023-09-22 DIAGNOSIS — F1120 Opioid dependence, uncomplicated: Secondary | ICD-10-CM | POA: Diagnosis not present

## 2023-09-23 DIAGNOSIS — F1120 Opioid dependence, uncomplicated: Secondary | ICD-10-CM | POA: Diagnosis not present

## 2023-09-23 DIAGNOSIS — F1320 Sedative, hypnotic or anxiolytic dependence, uncomplicated: Secondary | ICD-10-CM | POA: Diagnosis not present

## 2023-09-24 DIAGNOSIS — F1120 Opioid dependence, uncomplicated: Secondary | ICD-10-CM | POA: Diagnosis not present

## 2023-09-24 DIAGNOSIS — F1320 Sedative, hypnotic or anxiolytic dependence, uncomplicated: Secondary | ICD-10-CM | POA: Diagnosis not present

## 2023-09-25 DIAGNOSIS — F1120 Opioid dependence, uncomplicated: Secondary | ICD-10-CM | POA: Diagnosis not present

## 2023-09-25 DIAGNOSIS — F1320 Sedative, hypnotic or anxiolytic dependence, uncomplicated: Secondary | ICD-10-CM | POA: Diagnosis not present

## 2023-09-27 DIAGNOSIS — F1320 Sedative, hypnotic or anxiolytic dependence, uncomplicated: Secondary | ICD-10-CM | POA: Diagnosis not present

## 2023-09-27 DIAGNOSIS — F1120 Opioid dependence, uncomplicated: Secondary | ICD-10-CM | POA: Diagnosis not present

## 2023-09-28 DIAGNOSIS — F1320 Sedative, hypnotic or anxiolytic dependence, uncomplicated: Secondary | ICD-10-CM | POA: Diagnosis not present

## 2023-09-28 DIAGNOSIS — F1120 Opioid dependence, uncomplicated: Secondary | ICD-10-CM | POA: Diagnosis not present

## 2023-09-29 DIAGNOSIS — F1120 Opioid dependence, uncomplicated: Secondary | ICD-10-CM | POA: Diagnosis not present

## 2023-09-29 DIAGNOSIS — F1320 Sedative, hypnotic or anxiolytic dependence, uncomplicated: Secondary | ICD-10-CM | POA: Diagnosis not present

## 2023-09-30 DIAGNOSIS — F1320 Sedative, hypnotic or anxiolytic dependence, uncomplicated: Secondary | ICD-10-CM | POA: Diagnosis not present

## 2023-09-30 DIAGNOSIS — F1120 Opioid dependence, uncomplicated: Secondary | ICD-10-CM | POA: Diagnosis not present

## 2023-10-01 DIAGNOSIS — F1120 Opioid dependence, uncomplicated: Secondary | ICD-10-CM | POA: Diagnosis not present

## 2023-10-01 DIAGNOSIS — F1320 Sedative, hypnotic or anxiolytic dependence, uncomplicated: Secondary | ICD-10-CM | POA: Diagnosis not present

## 2023-10-02 DIAGNOSIS — F331 Major depressive disorder, recurrent, moderate: Secondary | ICD-10-CM | POA: Diagnosis not present

## 2023-10-02 DIAGNOSIS — F1120 Opioid dependence, uncomplicated: Secondary | ICD-10-CM | POA: Diagnosis not present

## 2023-10-02 DIAGNOSIS — F1420 Cocaine dependence, uncomplicated: Secondary | ICD-10-CM | POA: Diagnosis not present

## 2023-10-05 DIAGNOSIS — F1120 Opioid dependence, uncomplicated: Secondary | ICD-10-CM | POA: Diagnosis not present

## 2023-10-05 DIAGNOSIS — F1420 Cocaine dependence, uncomplicated: Secondary | ICD-10-CM | POA: Diagnosis not present

## 2023-10-05 DIAGNOSIS — F331 Major depressive disorder, recurrent, moderate: Secondary | ICD-10-CM | POA: Diagnosis not present

## 2023-10-06 DIAGNOSIS — F1120 Opioid dependence, uncomplicated: Secondary | ICD-10-CM | POA: Diagnosis not present

## 2023-10-06 DIAGNOSIS — F1420 Cocaine dependence, uncomplicated: Secondary | ICD-10-CM | POA: Diagnosis not present

## 2023-10-06 DIAGNOSIS — F331 Major depressive disorder, recurrent, moderate: Secondary | ICD-10-CM | POA: Diagnosis not present

## 2023-10-07 DIAGNOSIS — F1420 Cocaine dependence, uncomplicated: Secondary | ICD-10-CM | POA: Diagnosis not present

## 2023-10-07 DIAGNOSIS — F1520 Other stimulant dependence, uncomplicated: Secondary | ICD-10-CM | POA: Diagnosis not present

## 2023-10-07 DIAGNOSIS — F331 Major depressive disorder, recurrent, moderate: Secondary | ICD-10-CM | POA: Diagnosis not present

## 2023-10-07 DIAGNOSIS — F1120 Opioid dependence, uncomplicated: Secondary | ICD-10-CM | POA: Diagnosis not present

## 2023-10-07 DIAGNOSIS — F3181 Bipolar II disorder: Secondary | ICD-10-CM | POA: Diagnosis not present

## 2023-10-08 DIAGNOSIS — F1120 Opioid dependence, uncomplicated: Secondary | ICD-10-CM | POA: Diagnosis not present

## 2023-10-08 DIAGNOSIS — F1420 Cocaine dependence, uncomplicated: Secondary | ICD-10-CM | POA: Diagnosis not present

## 2023-10-08 DIAGNOSIS — F331 Major depressive disorder, recurrent, moderate: Secondary | ICD-10-CM | POA: Diagnosis not present

## 2023-10-09 DIAGNOSIS — F1120 Opioid dependence, uncomplicated: Secondary | ICD-10-CM | POA: Diagnosis not present

## 2023-10-09 DIAGNOSIS — F1420 Cocaine dependence, uncomplicated: Secondary | ICD-10-CM | POA: Diagnosis not present

## 2023-10-09 DIAGNOSIS — F331 Major depressive disorder, recurrent, moderate: Secondary | ICD-10-CM | POA: Diagnosis not present

## 2023-10-12 DIAGNOSIS — F331 Major depressive disorder, recurrent, moderate: Secondary | ICD-10-CM | POA: Diagnosis not present

## 2023-10-12 DIAGNOSIS — F1120 Opioid dependence, uncomplicated: Secondary | ICD-10-CM | POA: Diagnosis not present

## 2023-10-12 DIAGNOSIS — F1420 Cocaine dependence, uncomplicated: Secondary | ICD-10-CM | POA: Diagnosis not present

## 2023-10-14 DIAGNOSIS — F331 Major depressive disorder, recurrent, moderate: Secondary | ICD-10-CM | POA: Diagnosis not present

## 2023-10-14 DIAGNOSIS — F1120 Opioid dependence, uncomplicated: Secondary | ICD-10-CM | POA: Diagnosis not present

## 2023-10-14 DIAGNOSIS — F1420 Cocaine dependence, uncomplicated: Secondary | ICD-10-CM | POA: Diagnosis not present

## 2023-10-16 DIAGNOSIS — F331 Major depressive disorder, recurrent, moderate: Secondary | ICD-10-CM | POA: Diagnosis not present

## 2023-10-16 DIAGNOSIS — F1420 Cocaine dependence, uncomplicated: Secondary | ICD-10-CM | POA: Diagnosis not present

## 2023-10-16 DIAGNOSIS — F1120 Opioid dependence, uncomplicated: Secondary | ICD-10-CM | POA: Diagnosis not present

## 2023-10-19 DIAGNOSIS — F1420 Cocaine dependence, uncomplicated: Secondary | ICD-10-CM | POA: Diagnosis not present

## 2023-10-19 DIAGNOSIS — F1120 Opioid dependence, uncomplicated: Secondary | ICD-10-CM | POA: Diagnosis not present

## 2023-10-19 DIAGNOSIS — F331 Major depressive disorder, recurrent, moderate: Secondary | ICD-10-CM | POA: Diagnosis not present

## 2023-10-20 DIAGNOSIS — F1420 Cocaine dependence, uncomplicated: Secondary | ICD-10-CM | POA: Diagnosis not present

## 2023-10-20 DIAGNOSIS — F331 Major depressive disorder, recurrent, moderate: Secondary | ICD-10-CM | POA: Diagnosis not present

## 2023-10-20 DIAGNOSIS — F1520 Other stimulant dependence, uncomplicated: Secondary | ICD-10-CM | POA: Diagnosis not present

## 2023-10-20 DIAGNOSIS — F3181 Bipolar II disorder: Secondary | ICD-10-CM | POA: Diagnosis not present

## 2023-10-20 DIAGNOSIS — F1120 Opioid dependence, uncomplicated: Secondary | ICD-10-CM | POA: Diagnosis not present

## 2023-10-21 DIAGNOSIS — F1120 Opioid dependence, uncomplicated: Secondary | ICD-10-CM | POA: Diagnosis not present

## 2023-10-21 DIAGNOSIS — F1420 Cocaine dependence, uncomplicated: Secondary | ICD-10-CM | POA: Diagnosis not present

## 2023-10-21 DIAGNOSIS — F331 Major depressive disorder, recurrent, moderate: Secondary | ICD-10-CM | POA: Diagnosis not present

## 2023-10-22 DIAGNOSIS — F331 Major depressive disorder, recurrent, moderate: Secondary | ICD-10-CM | POA: Diagnosis not present

## 2023-10-22 DIAGNOSIS — F1420 Cocaine dependence, uncomplicated: Secondary | ICD-10-CM | POA: Diagnosis not present

## 2023-10-22 DIAGNOSIS — F1120 Opioid dependence, uncomplicated: Secondary | ICD-10-CM | POA: Diagnosis not present

## 2023-10-23 DIAGNOSIS — F331 Major depressive disorder, recurrent, moderate: Secondary | ICD-10-CM | POA: Diagnosis not present

## 2023-10-23 DIAGNOSIS — F1420 Cocaine dependence, uncomplicated: Secondary | ICD-10-CM | POA: Diagnosis not present

## 2023-10-23 DIAGNOSIS — F1120 Opioid dependence, uncomplicated: Secondary | ICD-10-CM | POA: Diagnosis not present

## 2023-10-26 DIAGNOSIS — F1420 Cocaine dependence, uncomplicated: Secondary | ICD-10-CM | POA: Diagnosis not present

## 2023-10-26 DIAGNOSIS — F331 Major depressive disorder, recurrent, moderate: Secondary | ICD-10-CM | POA: Diagnosis not present

## 2023-10-26 DIAGNOSIS — F1120 Opioid dependence, uncomplicated: Secondary | ICD-10-CM | POA: Diagnosis not present

## 2023-10-27 DIAGNOSIS — F1420 Cocaine dependence, uncomplicated: Secondary | ICD-10-CM | POA: Diagnosis not present

## 2023-10-27 DIAGNOSIS — F1120 Opioid dependence, uncomplicated: Secondary | ICD-10-CM | POA: Diagnosis not present

## 2023-10-27 DIAGNOSIS — F331 Major depressive disorder, recurrent, moderate: Secondary | ICD-10-CM | POA: Diagnosis not present

## 2023-10-28 DIAGNOSIS — F331 Major depressive disorder, recurrent, moderate: Secondary | ICD-10-CM | POA: Diagnosis not present

## 2023-10-28 DIAGNOSIS — F1420 Cocaine dependence, uncomplicated: Secondary | ICD-10-CM | POA: Diagnosis not present

## 2023-10-28 DIAGNOSIS — F1520 Other stimulant dependence, uncomplicated: Secondary | ICD-10-CM | POA: Diagnosis not present

## 2023-10-28 DIAGNOSIS — F3181 Bipolar II disorder: Secondary | ICD-10-CM | POA: Diagnosis not present

## 2023-10-28 DIAGNOSIS — F1120 Opioid dependence, uncomplicated: Secondary | ICD-10-CM | POA: Diagnosis not present

## 2023-10-29 DIAGNOSIS — F331 Major depressive disorder, recurrent, moderate: Secondary | ICD-10-CM | POA: Diagnosis not present

## 2023-10-29 DIAGNOSIS — F1420 Cocaine dependence, uncomplicated: Secondary | ICD-10-CM | POA: Diagnosis not present

## 2023-10-29 DIAGNOSIS — F1120 Opioid dependence, uncomplicated: Secondary | ICD-10-CM | POA: Diagnosis not present

## 2023-10-30 DIAGNOSIS — F1420 Cocaine dependence, uncomplicated: Secondary | ICD-10-CM | POA: Diagnosis not present

## 2023-10-30 DIAGNOSIS — F331 Major depressive disorder, recurrent, moderate: Secondary | ICD-10-CM | POA: Diagnosis not present

## 2023-10-30 DIAGNOSIS — F1120 Opioid dependence, uncomplicated: Secondary | ICD-10-CM | POA: Diagnosis not present

## 2023-11-04 DIAGNOSIS — F1320 Sedative, hypnotic or anxiolytic dependence, uncomplicated: Secondary | ICD-10-CM | POA: Diagnosis not present

## 2023-11-04 DIAGNOSIS — K449 Diaphragmatic hernia without obstruction or gangrene: Secondary | ICD-10-CM | POA: Diagnosis not present

## 2023-11-04 DIAGNOSIS — F319 Bipolar disorder, unspecified: Secondary | ICD-10-CM | POA: Diagnosis not present

## 2023-11-04 DIAGNOSIS — Z5181 Encounter for therapeutic drug level monitoring: Secondary | ICD-10-CM | POA: Diagnosis not present

## 2023-11-04 DIAGNOSIS — F1120 Opioid dependence, uncomplicated: Secondary | ICD-10-CM | POA: Diagnosis not present

## 2023-11-04 DIAGNOSIS — K219 Gastro-esophageal reflux disease without esophagitis: Secondary | ICD-10-CM | POA: Diagnosis not present

## 2023-11-04 DIAGNOSIS — F1420 Cocaine dependence, uncomplicated: Secondary | ICD-10-CM | POA: Diagnosis not present

## 2023-11-04 DIAGNOSIS — Z789 Other specified health status: Secondary | ICD-10-CM | POA: Diagnosis not present

## 2023-11-04 DIAGNOSIS — E291 Testicular hypofunction: Secondary | ICD-10-CM | POA: Diagnosis not present

## 2023-11-06 DIAGNOSIS — F1120 Opioid dependence, uncomplicated: Secondary | ICD-10-CM | POA: Diagnosis not present

## 2023-11-06 DIAGNOSIS — F1320 Sedative, hypnotic or anxiolytic dependence, uncomplicated: Secondary | ICD-10-CM | POA: Diagnosis not present

## 2023-11-09 DIAGNOSIS — F1120 Opioid dependence, uncomplicated: Secondary | ICD-10-CM | POA: Diagnosis not present

## 2023-11-09 DIAGNOSIS — F1320 Sedative, hypnotic or anxiolytic dependence, uncomplicated: Secondary | ICD-10-CM | POA: Diagnosis not present

## 2023-11-11 DIAGNOSIS — F1320 Sedative, hypnotic or anxiolytic dependence, uncomplicated: Secondary | ICD-10-CM | POA: Diagnosis not present

## 2023-11-11 DIAGNOSIS — F1120 Opioid dependence, uncomplicated: Secondary | ICD-10-CM | POA: Diagnosis not present

## 2023-11-13 DIAGNOSIS — F1120 Opioid dependence, uncomplicated: Secondary | ICD-10-CM | POA: Diagnosis not present

## 2023-11-13 DIAGNOSIS — F1320 Sedative, hypnotic or anxiolytic dependence, uncomplicated: Secondary | ICD-10-CM | POA: Diagnosis not present

## 2023-11-16 DIAGNOSIS — F1320 Sedative, hypnotic or anxiolytic dependence, uncomplicated: Secondary | ICD-10-CM | POA: Diagnosis not present

## 2023-11-16 DIAGNOSIS — F1120 Opioid dependence, uncomplicated: Secondary | ICD-10-CM | POA: Diagnosis not present

## 2023-11-17 DIAGNOSIS — F1120 Opioid dependence, uncomplicated: Secondary | ICD-10-CM | POA: Diagnosis not present

## 2023-11-17 DIAGNOSIS — F1320 Sedative, hypnotic or anxiolytic dependence, uncomplicated: Secondary | ICD-10-CM | POA: Diagnosis not present

## 2023-11-18 DIAGNOSIS — F1120 Opioid dependence, uncomplicated: Secondary | ICD-10-CM | POA: Diagnosis not present

## 2023-11-18 DIAGNOSIS — F1320 Sedative, hypnotic or anxiolytic dependence, uncomplicated: Secondary | ICD-10-CM | POA: Diagnosis not present

## 2023-11-18 DIAGNOSIS — F319 Bipolar disorder, unspecified: Secondary | ICD-10-CM | POA: Diagnosis not present

## 2023-11-18 DIAGNOSIS — F902 Attention-deficit hyperactivity disorder, combined type: Secondary | ICD-10-CM | POA: Diagnosis not present

## 2023-11-18 DIAGNOSIS — E291 Testicular hypofunction: Secondary | ICD-10-CM | POA: Diagnosis not present

## 2023-11-23 DIAGNOSIS — F1120 Opioid dependence, uncomplicated: Secondary | ICD-10-CM | POA: Diagnosis not present

## 2023-11-23 DIAGNOSIS — F1320 Sedative, hypnotic or anxiolytic dependence, uncomplicated: Secondary | ICD-10-CM | POA: Diagnosis not present

## 2023-11-25 DIAGNOSIS — F1320 Sedative, hypnotic or anxiolytic dependence, uncomplicated: Secondary | ICD-10-CM | POA: Diagnosis not present

## 2023-11-25 DIAGNOSIS — F1120 Opioid dependence, uncomplicated: Secondary | ICD-10-CM | POA: Diagnosis not present

## 2023-11-27 DIAGNOSIS — F1320 Sedative, hypnotic or anxiolytic dependence, uncomplicated: Secondary | ICD-10-CM | POA: Diagnosis not present

## 2023-11-27 DIAGNOSIS — F1120 Opioid dependence, uncomplicated: Secondary | ICD-10-CM | POA: Diagnosis not present

## 2023-11-30 DIAGNOSIS — F1320 Sedative, hypnotic or anxiolytic dependence, uncomplicated: Secondary | ICD-10-CM | POA: Diagnosis not present

## 2023-11-30 DIAGNOSIS — F1120 Opioid dependence, uncomplicated: Secondary | ICD-10-CM | POA: Diagnosis not present

## 2023-12-02 DIAGNOSIS — F1320 Sedative, hypnotic or anxiolytic dependence, uncomplicated: Secondary | ICD-10-CM | POA: Diagnosis not present

## 2023-12-02 DIAGNOSIS — K219 Gastro-esophageal reflux disease without esophagitis: Secondary | ICD-10-CM | POA: Diagnosis not present

## 2023-12-02 DIAGNOSIS — F1120 Opioid dependence, uncomplicated: Secondary | ICD-10-CM | POA: Diagnosis not present

## 2023-12-02 DIAGNOSIS — F902 Attention-deficit hyperactivity disorder, combined type: Secondary | ICD-10-CM | POA: Diagnosis not present

## 2023-12-02 DIAGNOSIS — I10 Essential (primary) hypertension: Secondary | ICD-10-CM | POA: Diagnosis not present

## 2023-12-04 DIAGNOSIS — F1320 Sedative, hypnotic or anxiolytic dependence, uncomplicated: Secondary | ICD-10-CM | POA: Diagnosis not present

## 2023-12-04 DIAGNOSIS — F1120 Opioid dependence, uncomplicated: Secondary | ICD-10-CM | POA: Diagnosis not present

## 2023-12-07 DIAGNOSIS — F1320 Sedative, hypnotic or anxiolytic dependence, uncomplicated: Secondary | ICD-10-CM | POA: Diagnosis not present

## 2023-12-07 DIAGNOSIS — F1120 Opioid dependence, uncomplicated: Secondary | ICD-10-CM | POA: Diagnosis not present

## 2023-12-09 DIAGNOSIS — Z789 Other specified health status: Secondary | ICD-10-CM | POA: Diagnosis not present

## 2023-12-09 DIAGNOSIS — F902 Attention-deficit hyperactivity disorder, combined type: Secondary | ICD-10-CM | POA: Diagnosis not present

## 2023-12-09 DIAGNOSIS — Z113 Encounter for screening for infections with a predominantly sexual mode of transmission: Secondary | ICD-10-CM | POA: Diagnosis not present

## 2023-12-09 DIAGNOSIS — F1120 Opioid dependence, uncomplicated: Secondary | ICD-10-CM | POA: Diagnosis not present

## 2023-12-09 DIAGNOSIS — Z136 Encounter for screening for cardiovascular disorders: Secondary | ICD-10-CM | POA: Diagnosis not present

## 2023-12-09 DIAGNOSIS — F1320 Sedative, hypnotic or anxiolytic dependence, uncomplicated: Secondary | ICD-10-CM | POA: Diagnosis not present

## 2023-12-09 DIAGNOSIS — F319 Bipolar disorder, unspecified: Secondary | ICD-10-CM | POA: Diagnosis not present

## 2023-12-09 DIAGNOSIS — E291 Testicular hypofunction: Secondary | ICD-10-CM | POA: Diagnosis not present

## 2023-12-11 DIAGNOSIS — F1120 Opioid dependence, uncomplicated: Secondary | ICD-10-CM | POA: Diagnosis not present

## 2023-12-11 DIAGNOSIS — F1320 Sedative, hypnotic or anxiolytic dependence, uncomplicated: Secondary | ICD-10-CM | POA: Diagnosis not present

## 2023-12-14 DIAGNOSIS — F1320 Sedative, hypnotic or anxiolytic dependence, uncomplicated: Secondary | ICD-10-CM | POA: Diagnosis not present

## 2023-12-14 DIAGNOSIS — F1120 Opioid dependence, uncomplicated: Secondary | ICD-10-CM | POA: Diagnosis not present

## 2023-12-23 DIAGNOSIS — F410 Panic disorder [episodic paroxysmal anxiety] without agoraphobia: Secondary | ICD-10-CM | POA: Diagnosis not present

## 2023-12-23 DIAGNOSIS — F900 Attention-deficit hyperactivity disorder, predominantly inattentive type: Secondary | ICD-10-CM | POA: Diagnosis not present

## 2023-12-23 DIAGNOSIS — F331 Major depressive disorder, recurrent, moderate: Secondary | ICD-10-CM | POA: Diagnosis not present

## 2024-01-26 ENCOUNTER — Ambulatory Visit (INDEPENDENT_AMBULATORY_CARE_PROVIDER_SITE_OTHER): Admitting: Family Medicine

## 2024-01-26 ENCOUNTER — Encounter: Payer: Self-pay | Admitting: Family Medicine

## 2024-01-26 VITALS — BP 98/76 | HR 56 | Wt 189.0 lb

## 2024-01-26 DIAGNOSIS — E291 Testicular hypofunction: Secondary | ICD-10-CM

## 2024-01-26 LAB — TESTOSTERONE: Testosterone: 89.9 ng/dL — ABNORMAL LOW (ref 300.00–890.00)

## 2024-01-26 MED ORDER — TESTOSTERONE CYPIONATE 200 MG/ML IM SOLN: 200.0000 mg | INTRAMUSCULAR | 2 refills | Status: DC

## 2024-01-26 NOTE — Progress Notes (Signed)
   Subjective:    Patient ID: Ryan Olsen, male    DOB: [birth year], 42 y.o.   MRN: [id number]  HPI Here for refills on testosterone . We have not seen him for 2 years. He says he lived in [place] for a while, but then he got into trouble with substance abuse again. He ended up spending 30 days in a rehab facility near there, and then he spent another 30 days in a different reha b facility. Then about one month ago he moved back to [place]. He is seeing Dr. Emilio Aurora for psychiatric care. His last testosterone  shot was 4 weeks ago.    Review of Systems  Constitutional: Negative.   Respiratory: Negative.    Cardiovascular: Negative.   Neurological: Negative.        Objective:   Physical Exam Constitutional:      Appearance: Normal appearance.  Cardiovascular:     Rate and Rhythm: Normal rate and regular rhythm.     Pulses: Normal pulses.     Heart sounds: Normal heart sounds.  Pulmonary:     Effort: Pulmonary effort is normal.     Breath sounds: Normal breath sounds.  Neurological:     Mental Status: He is alert and oriented to person, place, and time.           Assessment & Plan:  Hypogonadism. We will check a testosterone  level today. We will also write refills for him to start back on his previous regimen. Garnette Olmsted, MD

## 2024-01-28 ENCOUNTER — Ambulatory Visit: Payer: Self-pay | Admitting: Family Medicine

## 2024-02-12 NOTE — Telephone Encounter (Signed)
 I wrote a refill for him to get back on testosterone  shots 2 weeks ago. He can do these shots for 90 days and then we will check another level

## 2024-02-25 DIAGNOSIS — F410 Panic disorder [episodic paroxysmal anxiety] without agoraphobia: Secondary | ICD-10-CM | POA: Diagnosis not present

## 2024-02-25 DIAGNOSIS — F900 Attention-deficit hyperactivity disorder, predominantly inattentive type: Secondary | ICD-10-CM | POA: Diagnosis not present

## 2024-02-25 DIAGNOSIS — F331 Major depressive disorder, recurrent, moderate: Secondary | ICD-10-CM | POA: Diagnosis not present

## 2024-03-17 ENCOUNTER — Ambulatory Visit: Admitting: Family Medicine

## 2024-03-18 ENCOUNTER — Encounter: Payer: Self-pay | Admitting: Family Medicine

## 2024-03-18 ENCOUNTER — Ambulatory Visit: Admitting: Family Medicine

## 2024-03-18 VITALS — BP 128/76 | HR 72 | Temp 98.1°F | Wt 174.0 lb

## 2024-03-18 DIAGNOSIS — E291 Testicular hypofunction: Secondary | ICD-10-CM | POA: Diagnosis not present

## 2024-03-18 DIAGNOSIS — R5383 Other fatigue: Secondary | ICD-10-CM | POA: Diagnosis not present

## 2024-03-18 MED ORDER — TESTOSTERONE CYPIONATE 200 MG/ML IM SOLN: 200.0000 mg | INTRAMUSCULAR | 5 refills | Status: AC

## 2024-03-18 NOTE — Progress Notes (Signed)
   Subjective:    Patient ID: Ryan Olsen, male    DOB: 04/20/1981, 43 y.o.   MRN: [id number]  HPI Here to follow up on testosterone  replacement and for other issues. He has been taking shots every 14 days, but he thinks the dosage is too low. He still feels tired all the time and his libido is quite low. He asks for more complete labs to be checked fro this fatigue.   Review of Systems  Constitutional:  Positive for fatigue.  Respiratory: Negative.    Cardiovascular: Negative.   Gastrointestinal: Negative.   Genitourinary: Negative.        Objective:   Physical Exam Constitutional:      Appearance: Normal appearance.  Cardiovascular:     Rate and Rhythm: Normal rate and regular rhythm.     Pulses: Normal pulses.     Heart sounds: Normal heart sounds.  Pulmonary:     Effort: Pulmonary effort is normal.  Neurological:     Mental Status: He is alert.           Assessment & Plan:  For the testosterone  replacement, we will increase the shots to every 7 days. Recheck a level in 3 months. Today we will screen for diabetes, anemia, thyroid  disorders, etc.  Garnette Olmsted, MD

## 2024-03-19 LAB — CBC WITH DIFFERENTIAL/PLATELET
Absolute Lymphocytes: 984 {cells}/uL (ref 850–3900)
Absolute Monocytes: 320 {cells}/uL (ref 200–950)
Basophils Absolute: 41 {cells}/uL (ref 0–200)
Basophils Relative: 1 %
Eosinophils Absolute: 41 {cells}/uL (ref 15–500)
Eosinophils Relative: 1 %
HCT: 51.2 % — ABNORMAL HIGH (ref 38.5–50.0)
Hemoglobin: 17.2 g/dL — ABNORMAL HIGH (ref 13.2–17.1)
MCH: 28.7 pg (ref 27.0–33.0)
MCHC: 33.6 g/dL (ref 32.0–36.0)
MCV: 85.3 fL (ref 80.0–100.0)
MPV: 9.9 fL (ref 7.5–12.5)
Monocytes Relative: 7.8 %
Neutro Abs: 2714 {cells}/uL (ref 1500–7800)
Neutrophils Relative %: 66.2 %
Platelets: 345 Thousand/uL (ref 140–400)
RBC: 6 Million/uL — ABNORMAL HIGH (ref 4.20–5.80)
RDW: 15.7 % — ABNORMAL HIGH (ref 11.0–15.0)
Total Lymphocyte: 24 %
WBC: 4.1 Thousand/uL (ref 3.8–10.8)

## 2024-03-19 LAB — BASIC METABOLIC PANEL WITH GFR
BUN: 18 mg/dL (ref 7–25)
CO2: 26 mmol/L (ref 20–32)
Calcium: 9.4 mg/dL (ref 8.6–10.3)
Chloride: 103 mmol/L (ref 98–110)
Creat: 0.93 mg/dL (ref 0.60–1.29)
Glucose, Bld: 89 mg/dL (ref 65–99)
Potassium: 5 mmol/L (ref 3.5–5.3)
Sodium: 138 mmol/L (ref 135–146)
eGFR: 104 mL/min/1.73m2 (ref >=60)

## 2024-03-19 LAB — HEMOGLOBIN A1C
Hgb A1c MFr Bld: 5.5 % (ref <5.7)
Mean Plasma Glucose: 111 mg/dL
eAG (mmol/L): 6.2 mmol/L

## 2024-03-19 LAB — HEPATIC FUNCTION PANEL
AG Ratio: 1.7 (calc) (ref 1.0–2.5)
ALT: 15 U/L (ref 9–46)
AST: 16 U/L (ref 10–40)
Albumin: 4.8 g/dL (ref 3.6–5.1)
Alkaline phosphatase (APISO): 86 U/L (ref 36–130)
Bilirubin, Direct: 0.1 mg/dL (ref 0.0–0.2)
Globulin: 2.9 g/dL (ref 1.9–3.7)
Indirect Bilirubin: 0.4 mg/dL (ref 0.2–1.2)
Total Bilirubin: 0.5 mg/dL (ref 0.2–1.2)
Total Protein: 7.7 g/dL (ref 6.1–8.1)

## 2024-03-19 LAB — T4, FREE: Free T4: 0.9 ng/dL (ref 0.8–1.8)

## 2024-03-19 LAB — VITAMIN B12: Vitamin B-12: 1255 pg/mL — ABNORMAL HIGH (ref 200–1100)

## 2024-03-19 LAB — TESTOSTERONE: Testosterone: 170 ng/dL — ABNORMAL LOW (ref 250–827)

## 2024-03-19 LAB — TSH: TSH: 2.75 m[IU]/L (ref 0.40–4.50)

## 2024-03-19 LAB — T3, FREE: T3, Free: 3.1 pg/mL (ref 2.3–4.2)

## 2024-03-21 ENCOUNTER — Ambulatory Visit: Payer: Self-pay | Admitting: Family Medicine

## 2024-03-23 ENCOUNTER — Telehealth: Payer: Self-pay

## 2024-03-23 NOTE — Telephone Encounter (Signed)
 Left a detailed message for pt advised to call the office back to review lab results Copied from CRM [id number]. Topic: Clinical - Lab/Test Results >> Mar 22, 2024  8:54 AM Carlyon D wrote: Reason for CRM: Pt calling in regards to lab results pt would like a call back to discuss as he states a lot of them show abnormal.  Call back # [phone]

## 2024-03-31 DIAGNOSIS — F3181 Bipolar II disorder: Secondary | ICD-10-CM | POA: Diagnosis not present

## 2024-03-31 DIAGNOSIS — Z5181 Encounter for therapeutic drug level monitoring: Secondary | ICD-10-CM | POA: Diagnosis not present

## 2024-03-31 DIAGNOSIS — F411 Generalized anxiety disorder: Secondary | ICD-10-CM | POA: Diagnosis not present

## 2024-04-26 DIAGNOSIS — F900 Attention-deficit hyperactivity disorder, predominantly inattentive type: Secondary | ICD-10-CM | POA: Diagnosis not present

## 2024-04-26 DIAGNOSIS — F410 Panic disorder [episodic paroxysmal anxiety] without agoraphobia: Secondary | ICD-10-CM | POA: Diagnosis not present

## 2024-04-26 DIAGNOSIS — F3342 Major depressive disorder, recurrent, in full remission: Secondary | ICD-10-CM | POA: Diagnosis not present
